# Patient Record
Sex: Female | Born: 2000 | Race: White | Hispanic: No | Marital: Single | Smoking: Never smoker
Health system: Southern US, Community
[De-identification: ages and names within clinical notes are randomized; demographics above are authoritative.]

## PROBLEM LIST (undated history)

## (undated) DIAGNOSIS — F419 Anxiety disorder, unspecified: Secondary | ICD-10-CM

## (undated) DIAGNOSIS — R569 Unspecified convulsions: Secondary | ICD-10-CM

## (undated) DIAGNOSIS — N159 Renal tubulo-interstitial disease, unspecified: Secondary | ICD-10-CM

## (undated) DIAGNOSIS — K219 Gastro-esophageal reflux disease without esophagitis: Secondary | ICD-10-CM

## (undated) HISTORY — DX: Gastro-esophageal reflux disease without esophagitis: K21.9

## (undated) HISTORY — DX: Renal tubulo-interstitial disease, unspecified: N15.9

## (undated) HISTORY — DX: Anxiety disorder, unspecified: F41.9

## (undated) HISTORY — PX: WISDOM TOOTH EXTRACTION: SHX21

## (undated) HISTORY — PX: NO PAST SURGERIES: SHX2092

---
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
## 2021-01-02 ENCOUNTER — Ambulatory Visit
Admission: RE | Admit: 2021-01-02 | Discharge: 2021-01-02 | Disposition: A | Discharge status: Home / Self Care | Payer: Medicaid Other | Attending: Internal Medicine | Admitting: Internal Medicine

## 2021-01-02 ENCOUNTER — Other Ambulatory Visit: Payer: Self-pay

## 2021-01-02 ENCOUNTER — Ambulatory Visit
Admission: RE | Admit: 2021-01-02 | Discharge: 2021-01-02 | Disposition: A | Discharge status: Home / Self Care | Payer: Medicaid Other | Source: Ambulatory Visit | Attending: Internal Medicine | Admitting: Internal Medicine

## 2021-01-02 ENCOUNTER — Ambulatory Visit (INDEPENDENT_AMBULATORY_CARE_PROVIDER_SITE_OTHER): Payer: Medicaid Other | Admitting: Internal Medicine

## 2021-01-02 ENCOUNTER — Encounter: Payer: Self-pay | Admitting: Internal Medicine

## 2021-01-02 VITALS — BP 111/63 | HR 91 | Temp 97.7°F | Resp 17 | Ht 69.0 in | Wt 145.0 lb

## 2021-01-02 DIAGNOSIS — F419 Anxiety disorder, unspecified: Secondary | ICD-10-CM | POA: Diagnosis not present

## 2021-01-02 DIAGNOSIS — K219 Gastro-esophageal reflux disease without esophagitis: Secondary | ICD-10-CM

## 2021-01-02 DIAGNOSIS — M545 Low back pain, unspecified: Secondary | ICD-10-CM | POA: Insufficient documentation

## 2021-01-02 DIAGNOSIS — G8929 Other chronic pain: Secondary | ICD-10-CM

## 2021-01-02 DIAGNOSIS — Z3046 Encounter for surveillance of implantable subdermal contraceptive: Secondary | ICD-10-CM

## 2021-01-02 DIAGNOSIS — F32A Depression, unspecified: Secondary | ICD-10-CM

## 2021-01-02 NOTE — Assessment & Plan Note (Signed)
Stable on Venlafaxine Support offered 

## 2021-01-02 NOTE — Assessment & Plan Note (Signed)
X-ray lumbar spine ordered today Consider referral for PT versus MRI pending results

## 2021-01-02 NOTE — Progress Notes (Signed)
Subjective:    Patient ID: Dana Phillips, female    DOB: 09-24-2000, 20 y.o.   MRN: [id number]  HPI  Patient presents to clinic today for follow-up of chronic conditions.  She is establishing care with me today, transferring care from Malva Cogan, NP.  GERD: Currently not an issue. She is not taking any medications for this. There is no upper GI on file.  Anxiety and Depression: Chronic, managed on Venlafaxine.  She is not currently seeing a therapist.  She denies SI/HI.  Chronic Low Back Pain with Sciatica: She feels like this has been getting worse. She describes the pain sharp.  The pain radiates into her legs but she denies numbness, tingling or weakness. She takes CBD gummies for pain relief. She hs been seeing a chiropractor with minimal relief of symptoms. X-ray lumbar spine from 10/2015 reviewed.  She would like a referral to GYN for Nexplanon removal.  Review of Systems     Past Medical History:  Diagnosis Date   Anxiety    GERD (gastroesophageal reflux disease)    Kidney infection    Seizures (HCC)     Current Outpatient Medications  Medication Sig Dispense Refill   NON FORMULARY Take 75 mg by mouth daily. CBD Gummy     venlafaxine XR (EFFEXOR XR) 37.5 MG 24 hr capsule Take 1 capsule (37.5 mg total) by mouth daily with breakfast. 30 capsule 1   No current facility-administered medications for this visit.    No Known Allergies  Family History  Problem Relation Age of Onset   Hypertension Mother    Sudden Cardiac Death Neg Hx     Social History   Socioeconomic History   Marital status: Single    Spouse name: Not on file   Number of children: Not on file   Years of education: Not on file   Highest education level: Not on file  Occupational History   Not on file  Tobacco Use   Smoking status: Never   Smokeless tobacco: Never  Vaping Use   Vaping Use: Every day   Substances: Nicotine, Flavoring  Substance and Sexual Activity   Alcohol use: No    Drug use: No   Sexual activity: Not on file  Other Topics Concern   Not on file  Social History Narrative   Not on file   Social Determinants of Health   Financial Resource Strain: Not on file  Food Insecurity: Not on file  Transportation Needs: Not on file  Physical Activity: Not on file  Stress: Not on file  Social Connections: Not on file  Intimate Partner Violence: Not on file     Constitutional: Denies fever, malaise, fatigue, headache or abrupt weight changes.  HEENT: Denies eye pain, eye redness, ear pain, ringing in the ears, wax buildup, runny nose, nasal congestion, bloody nose, or sore throat. Respiratory: Denies difficulty breathing, shortness of breath, cough or sputum production.   Cardiovascular: Denies chest pain, chest tightness, palpitations or swelling in the hands or feet.  Gastrointestinal: Denies abdominal pain, bloating, constipation, diarrhea or blood in the stool.  GU: Denies urgency, frequency, pain with urination, burning sensation, blood in urine, odor or discharge. Musculoskeletal: Patient reports low back pain.  Denies decrease in range of motion, difficulty with gait, or joint swelling.  Skin: Denies redness, rashes, lesions or ulcercations.  Neurological: Denies dizziness, difficulty with memory, difficulty with speech or problems with balance and coordination.  Psych: Patient has a history of anxiety and depression.  Denies SI/HI.  No other specific complaints in a complete review of systems (except as listed in HPI above).  Objective:   Physical Exam   BP 111/63 (BP Location: Right Arm, Patient Position: Sitting, Cuff Size: Normal)   Pulse 91   Temp 97.7 F (36.5 C) (Temporal)   Resp 17   Ht 5\' 9"  (1.753 m)   Wt 145 lb (65.8 kg)   SpO2 100%   BMI 21.41 kg/m   Wt Readings from Last 3 Encounters:  11/27/20 131 lb (59.4 kg) (58 %, Z= 0.21)*  11/21/20 133 lb 3.2 oz (60.4 kg) (62 %, Z= 0.31)*  02/12/20 140 lb (63.5 kg) (74 %, Z= 0.66)*    * Growth percentiles are based on CDC (Girls, 2-20 Years) data.    General: Appears her stated age, well developed, well nourished in NAD. Skin: Warm, dry and intact.  HEENT: Head: normal shape and size; Eyes: EOMs intact;  Cardiovascular: Normal rate and rhythm. S1,S2 noted.  No murmur, rubs or gallops noted.  Pulmonary/Chest: Normal effort and positive vesicular breath sounds. No respiratory distress. No wheezes, rales or ronchi noted.  Abdomen: Soft and nontender. Normal bowel sounds.  Musculoskeletal: Bony tenderness noted over the lumbar spine.  Strength 5/5 BLE.  No difficulty with gait.  Neurological: Alert and oriented.  Psychiatric: Mood and affect mildly flat. Behavior is normal. Judgment and thought content normal.    BMET    Component Value Date/Time   NA 137 02/12/2020 1026   K 4.1 02/12/2020 1026   CL 102 02/12/2020 1026   CO2 25 02/12/2020 1026   GLUCOSE 103 (H) 02/12/2020 1026   BUN 10 02/12/2020 1026   CREATININE 0.67 02/12/2020 1026   CREATININE 0.69 10/05/2019 1559   CALCIUM 9.6 02/12/2020 1026   GFRNONAA >60 02/12/2020 1026   GFRNONAA SEE NOTE 07/22/2016 1157   GFRAA NOT CALCULATED 08/01/2019 2057   GFRAA SEE NOTE 07/22/2016 1157    Lipid Panel  No results found for: CHOL, TRIG, HDL, CHOLHDL, VLDL, LDLCALC  CBC    Component Value Date/Time   WBC 6.2 02/12/2020 1026   RBC 4.18 02/12/2020 1026   HGB 13.3 02/12/2020 1026   HCT 39.9 02/12/2020 1026   PLT 260 02/12/2020 1026   MCV 95.5 02/12/2020 1026   MCH 31.8 02/12/2020 1026   MCHC 33.3 02/12/2020 1026   RDW 12.4 02/12/2020 1026   LYMPHSABS 2,344 10/05/2019 1559   MONOABS 0.8 08/01/2019 2057   EOSABS 180 10/05/2019 1559   BASOSABS 19 10/05/2019 1559    Hgb A1C No results found for: HGBA1C         Assessment & Plan:   Encounter for removal and insertion of Nexplanon:  Referral to GYN to have this done  We will follow-up after imaging with further recommendation and treatment  plan 10/07/2019, NP This visit occurred during the SARS-CoV-2 public health emergency.  Safety protocols were in place, including screening questions prior to the visit, additional usage of staff PPE, and extensive cleaning of exam room while observing appropriate contact time as indicated for disinfecting solutions.

## 2021-01-02 NOTE — Patient Instructions (Signed)
Generalized Anxiety Disorder, Adult Generalized anxiety disorder (GAD) is a mental health condition. Unlike normal worries, anxiety related to GAD is not triggered by a specific event. These worries do not fade or get better with time. GAD interferes with relationships, work, and school. GAD symptoms can vary from mild to severe. People with severe GAD can have intense waves of anxiety with physical symptoms that are similar to panic attacks. What are the causes? The exact cause of GAD is not known, but the following are believed to have an impact: Differences in natural brain chemicals. Genes passed down from parents to children. Differences in the way threats are perceived. Development during childhood. Personality. What increases the risk? The following factors may make you more likely to develop this condition: Being female. Having a family history of anxiety disorders. Being very shy. Experiencing very stressful life events, such as the death of a loved one. Having a very stressful family environment. What are the signs or symptoms? People with GAD often worry excessively about many things in their lives, such as their health and family. Symptoms may also include: Mental and emotional symptoms: Worrying excessively about natural disasters. Fear of being late. Difficulty concentrating. Fears that others are judging your performance. Physical symptoms: Fatigue. Headaches, muscle tension, muscle twitches, trembling, or feeling shaky. Feeling like your heart is pounding or beating very fast. Feeling out of breath or like you cannot take a deep breath. Having trouble falling asleep or staying asleep, or experiencing restlessness. Sweating. Nausea, diarrhea, or irritable bowel syndrome (IBS). Behavioral symptoms: Experiencing erratic moods or irritability. Avoidance of new situations. Avoidance of people. Extreme difficulty making decisions. How is this diagnosed? This condition  is diagnosed based on your symptoms and medical history. You will also have a physical exam. Your health care provider may perform tests to rule out other possible causes of your symptoms. To be diagnosed with GAD, a person must have anxiety that: Is out of his or her control. Affects several different aspects of his or her life, such as work and relationships. Causes distress that makes him or her unable to take part in normal activities. Includes at least three symptoms of GAD, such as restlessness, fatigue, trouble concentrating, irritability, muscle tension, or sleep problems. Before your health care provider can confirm a diagnosis of GAD, these symptoms must be present more days than they are not, and they must last for 6 months or longer. How is this treated? This condition may be treated with: Medicine. Antidepressant medicine is usually prescribed for long-term daily control. Anti-anxiety medicines may be added in severe cases, especially when panic attacks occur. Talk therapy (psychotherapy). Certain types of talk therapy can be helpful in treating GAD by providing support, education, and guidance. Options include: Cognitive behavioral therapy (CBT). People learn coping skills and self-calming techniques to ease their physical symptoms. They learn to identify unrealistic thoughts and behaviors and to replace them with more appropriate thoughts and behaviors. Acceptance and commitment therapy (ACT). This treatment teaches people how to be mindful as a way to cope with unwanted thoughts and feelings. Biofeedback. This process trains you to manage your body's response (physiological response) through breathing techniques and relaxation methods. You will work with a therapist while machines are used to monitor your physical symptoms. Stress management techniques. These include yoga, meditation, and exercise. A mental health specialist can help determine which treatment is best for you. Some  people see improvement with one type of therapy. However, other people require a combination   of therapies. Follow these instructions at home: Lifestyle Maintain a consistent routine and schedule. Anticipate stressful situations. Create a plan, and allow extra time to work with your plan. Practice stress management or self-calming techniques that you have learned from your therapist or your health care provider. General instructions Take over-the-counter and prescription medicines only as told by your health care provider. Understand that you are likely to have setbacks. Accept this and be kind to yourself as you persist to take better care of yourself. Recognize and accept your accomplishments, even if you judge them as small. Keep all follow-up visits as told by your health care provider. This is important. Contact a health care provider if: Your symptoms do not get better. Your symptoms get worse. You have signs of depression, such as: A persistently sad or irritable mood. Loss of enjoyment in activities that used to bring you joy. Change in weight or eating. Changes in sleeping habits. Avoiding friends or family members. Loss of energy for normal tasks. Feelings of guilt or worthlessness. Get help right away if: You have serious thoughts about hurting yourself or others. If you ever feel like you may hurt yourself or others, or have thoughts about taking your own life, get help right away. Go to your nearest emergency department or: Call your local emergency services (911 in the [place] Call a suicide crisis helpline, such as the National Suicide Prevention Lifeline at 1-800-273-8255. This is open 24 hours a day in the [place] Text the Crisis Text Line at 741741 (in the [place] Summary Generalized anxiety disorder (GAD) is a mental health condition that involves worry that is not triggered by a specific event. People with GAD often worry excessively about many things in their lives, such  as their health and family. GAD may cause symptoms such as restlessness, trouble concentrating, sleep problems, frequent sweating, nausea, diarrhea, headaches, and trembling or muscle twitching. A mental health specialist can help determine which treatment is best for you. Some people see improvement with one type of therapy. However, other people require a combination of therapies. This information is not intended to replace advice given to you by your health care provider. Make sure you discuss any questions you have with your health care provider. Document Revised: 12/28/2018 Document Reviewed: 12/28/2018 Elsevier Patient Education  2022 Elsevier Inc.  

## 2021-01-03 ENCOUNTER — Telehealth: Payer: Self-pay

## 2021-01-03 NOTE — Telephone Encounter (Signed)
Dana Phillips medical referring for Nexplanon removal and insertion. Patient is scheduled for 10/18 at 9 :10 with ABC

## 2021-01-03 NOTE — Telephone Encounter (Signed)
Noted. Nexplanon reserved for this patient. 

## 2021-01-06 NOTE — Progress Notes (Signed)
Cardiology Office Note:    Date:  01/08/2021   ID:  Dana Phillips, DOB 2000/05/15, MRN [id number]  PCP:  Lorre Munroe, NP  [facility] Cardiologist:  None  [facility] Electrophysiologist:  None   Referring MD: Lorre Munroe, NP   Chief Complaint: F/u heart monitor  History of Present Illness:    Dana Phillips is a 20 y.o. female with a hx of near syncope, anxiety, GERD, seizures in the setting of diphenhydramine overdose. Seen 11/27/20 reporting episodes of lightheadedness. She was seen at the [facility] ED and 11/09/2020 for near syncopal episode. Work-up including echocardiogram, was unremarkable. She uses CBD gummies for anxiety.   Today, heart monitor reviewed in detail. Since the last visit has been feeling ok. No further LOC. Feels dizzy and lightheaded one day she as sick. Eating and drinking normally. Works at [employer]. On her feet all day. Drinks lots of water. Uses CBD gummies for anxiety and sleep. Recently started Effexor by PCP. Patient feels very stressed out. Trying to get counseling for personal issues. She has occasional palpitations, they seem to be worse with anxiety. She still vapes. No alcohol use.   Past Medical History:  Diagnosis Date   Anxiety    GERD (gastroesophageal reflux disease)    Kidney infection    Seizures (HCC)     History reviewed. No pertinent surgical history.  Current Medications: Current Meds  Medication Sig   etonogestrel (NEXPLANON) 68 MG IMPL implant 1 each by Subdermal route once.   NON FORMULARY Take 75 mg by mouth daily. CBD Gummy   venlafaxine XR (EFFEXOR XR) 37.5 MG 24 hr capsule Take 1 capsule (37.5 mg total) by mouth daily with breakfast.     Allergies:   Patient has no known allergies.   Social History   Socioeconomic History   Marital status: Single    Spouse name: Not on file   Number of children: Not on file   Years of education: Not on file   Highest education level: Not on file  Occupational  History   Not on file  Tobacco Use   Smoking status: Never   Smokeless tobacco: Never  Vaping Use   Vaping Use: Every day   Substances: Nicotine, Flavoring  Substance and Sexual Activity   Alcohol use: No   Drug use: No   Sexual activity: Not on file  Other Topics Concern   Not on file  Social History Narrative   Not on file   Social Determinants of Health   Financial Resource Strain: Not on file  Food Insecurity: Not on file  Transportation Needs: Not on file  Physical Activity: Not on file  Stress: Not on file  Social Connections: Not on file     Family History: The patient's family history includes Hypertension in her mother. There is no history of Sudden Cardiac Death.  ROS:   Please see the history of present illness.     All other systems reviewed and are negative.  EKGs/Labs/Other Studies Reviewed:    The following studies were reviewed today:   Heart monitor 11/2020 The patient was monitored for 13 days, 23 hours. The predominant rhythm was sinus with an average rate of 90 bpm (range 50-171 bpm). There were rare PAC's and PVC's. Four atrial runs lasting up to 13 beats occurred with a maximum rate of 162 bpm. No sustained arrhythmia or prolonged pause was observed. Patient triggered events correspond to sinus tachycardia (no symptoms reported).  Predominantly sinus rhythm with rare PAC's and PVC's, as well as a few brief episodes of PSVT.  EKG:  EKG is not ordered today.   Recent Labs: 02/12/2020: BUN 10; Creatinine, Ser 0.67; Hemoglobin 13.3; Platelets 260; Potassium 4.1; Sodium 137  Recent Lipid Panel No results found for: CHOL, TRIG, HDL, CHOLHDL, VLDL, LDLCALC, LDLDIRECT   Physical Exam:    VS:  BP 110/68 (BP Location: Left Arm, Patient Position: Sitting, Cuff Size: Normal)   Pulse 74   Ht 5\' 10"  (1.778 m)   Wt 139 lb (63 kg)   LMP 12/19/2020 (Approximate)   SpO2 98%   BMI 19.94 kg/m     Wt Readings from Last 3 Encounters:  01/08/21 139 lb  (63 kg) (70 %, Z= 0.52)*  01/02/21 145 lb (65.8 kg) (77 %, Z= 0.74)*  11/27/20 131 lb (59.4 kg) (58 %, Z= 0.21)*   * Growth percentiles are based on CDC (Girls, 2-20 Years) data.     GEN:  Well nourished, well developed in no acute distress HEENT: Normal NECK: No JVD; No carotid bruits LYMPHATICS: No lymphadenopathy CARDIAC: RRR, no murmurs, rubs, gallops RESPIRATORY:  Clear to auscultation without rales, wheezing or rhonchi  ABDOMEN: Soft, non-tender, non-distended MUSCULOSKELETAL:  No edema; No deformity  SKIN: Warm and dry NEUROLOGIC:  Alert and oriented x 3 PSYCHIATRIC:  Normal affect   ASSESSMENT:    1. Syncope, unspecified syncope type   2. Tobacco use    PLAN:    In order of problems listed above:  Near syncope Prior work-up by [facility] (with echo) was unremarkable. Recent heart monitor showed NSR, rare PACs/PVCs, and brief runs of SVT longest 13 beats, triggered events correlated with sinus tachycardia. Patient denies further syncopal episodes. She had one day feeling sick with GI issues, and felt dizzy and lightheaded that day. Other than that, she has been feeling well. She has been drinking more water, eating normally. BP and heart rate good. She has palpitations that seem to be worse with stress. Life and family are very stressful, seeking counseling/therapy for some personal issues. Suspect this might be contributing to some symptoms. She was recently started on Effexor, PCP is following this. At this point symptoms are well controlled. If palpitations worsen can consider short-acting diltiazem PRN. We will see her back in 3 months to assess symptoms.   Tobacco use She is still Vaping. Cessation advised.   Disposition: Follow up in 3 month(s) with MD/APP     Signed, Teriana Danker [facility], PA-C  01/08/2021 10:12 AM    [facility]

## 2021-01-07 ENCOUNTER — Encounter: Payer: Medicaid Other | Admitting: Obstetrics and Gynecology

## 2021-01-08 ENCOUNTER — Encounter: Payer: Self-pay | Admitting: Medical

## 2021-01-08 ENCOUNTER — Ambulatory Visit (INDEPENDENT_AMBULATORY_CARE_PROVIDER_SITE_OTHER): Payer: Medicaid Other | Admitting: Medical

## 2021-01-08 ENCOUNTER — Ambulatory Visit: Payer: Medicaid Other | Admitting: Medical

## 2021-01-08 ENCOUNTER — Other Ambulatory Visit: Payer: Self-pay

## 2021-01-08 VITALS — BP 110/68 | HR 74 | Ht 70.0 in | Wt 139.0 lb

## 2021-01-08 DIAGNOSIS — R55 Syncope and collapse: Secondary | ICD-10-CM | POA: Diagnosis not present

## 2021-01-08 DIAGNOSIS — Z720 Tobacco use: Secondary | ICD-10-CM | POA: Diagnosis not present

## 2021-01-08 NOTE — Patient Instructions (Signed)
Medication Instructions:  No changes at this time.   *If you need a refill on your cardiac medications before your next appointment, please call your pharmacy*   Lab Work: None  If you have labs (blood work) drawn today and your tests are completely normal, you will receive your results only by: MyChart Message (if you have MyChart) OR A paper copy in the mail If you have any lab test that is abnormal or we need to change your treatment, we will call you to review the results.   Testing/Procedures: None   Follow-Up: At [facility], you and your health needs are our priority.  As part of our continuing mission to provide you with exceptional heart care, we have created designated Provider Care Teams.  These Care Teams include your primary Cardiologist (physician) and Advanced Practice Providers (APPs -  Physician Assistants and Nurse Practitioners) who all work together to provide you with the care you need, when you need it.  Your next appointment:   1 month(s)  The format for your next appointment:   In Person  Provider:   Follow up in one month with Dr. Okey Dupre or Cadence Fransico Michael PA-C

## 2021-01-08 NOTE — Progress Notes (Signed)
Dana Munroe, NP   Chief Complaint  Patient presents with   Contraception    Nexplanon replacement    HPI:      [name] is a 20 y.o. No obstetric history on file. whose LMP was Patient's last menstrual period was 12/19/2020 (approximate)., presents today for NP nexplanon replacement, referred by PCP. Nexplanon placed 11/19. Has monthly menses, lasting 3-7 days, mod flow, no BTB, mild dysmen, improved with NSAIDs/heating pad prn. Would like another one.  She is sex active, no recent STD testing. No pain/bleeding with sex.  Past Medical History:  Diagnosis Date   Anxiety    GERD (gastroesophageal reflux disease)    Kidney infection    Seizures (HCC)     Past Surgical History:  Procedure Laterality Date   NO PAST SURGERIES      Family History  Problem Relation Age of Onset   Hypertension Mother    Sudden Cardiac Death Neg Hx     Social History   Socioeconomic History   Marital status: Single    Spouse name: Not on file   Number of children: Not on file   Years of education: Not on file   Highest education level: Not on file  Occupational History   Not on file  Tobacco Use   Smoking status: Never   Smokeless tobacco: Never  Vaping Use   Vaping Use: Every day   Substances: Nicotine, Flavoring  Substance and Sexual Activity   Alcohol use: No   Drug use: No   Sexual activity: Yes    Birth control/protection: Implant, Condom  Other Topics Concern   Not on file  Social History Narrative   Not on file   Social Determinants of Health   Financial Resource Strain: Not on file  Food Insecurity: Not on file  Transportation Needs: Not on file  Physical Activity: Not on file  Stress: Not on file  Social Connections: Not on file  Intimate Partner Violence: Not on file    Outpatient Medications Prior to Visit  Medication Sig Dispense Refill   NON FORMULARY Take 75 mg by mouth daily. CBD Gummy     venlafaxine XR (EFFEXOR XR) 37.5 MG 24 hr  capsule Take 1 capsule (37.5 mg total) by mouth daily with breakfast. 30 capsule 1   etonogestrel (NEXPLANON) 68 MG IMPL implant 1 each by Subdermal route once.     No facility-administered medications prior to visit.      ROS:  Review of Systems  Constitutional:  Negative for fever.  Gastrointestinal:  Negative for blood in stool, constipation, diarrhea, nausea and vomiting.  Genitourinary:  Negative for dyspareunia, dysuria, flank pain, frequency, hematuria, urgency, vaginal bleeding, vaginal discharge and vaginal pain.  Musculoskeletal:  Negative for back pain.  Skin:  Negative for rash.  BREAST: No symptoms   OBJECTIVE:   Vitals:  BP 120/70   Ht 5\' 10"  (1.778 m)   Wt 142 lb (64.4 kg)   LMP 12/19/2020 (Approximate)   BMI 20.37 kg/m   Physical Exam Vitals reviewed.  Constitutional:      Appearance: She is well-developed.  Pulmonary:     Effort: Pulmonary effort is normal.  Genitourinary:    General: Normal vulva.     Pubic Area: No rash.      Labia:        Right: No rash, tenderness or lesion.        Left: No rash, tenderness or lesion.  Vagina: Normal. No vaginal discharge, erythema or tenderness.     Cervix: Normal.     Uterus: Normal. Not enlarged and not tender.      Adnexa: Right adnexa normal and left adnexa normal.       Right: No mass or tenderness.         Left: No mass or tenderness.    Musculoskeletal:        General: Normal range of motion.     Cervical back: Normal range of motion.  Skin:    General: Skin is warm and dry.  Neurological:     General: No focal deficit present.     Mental Status: She is alert and oriented to person, place, and time.  Psychiatric:        Mood and Affect: Mood normal.        Behavior: Behavior normal.        Thought Content: Thought content normal.        Judgment: Judgment normal.    Nexplanon removal Procedure note - The Nexplanon was noted in the patient's arm and the end was identified. The skin was  cleansed with a Betadine solution. A small injection of subcutaneous lidocaine with epinephrine was given over the end of the implant. An incision was made at the end of the implant. The rod was noted in the incision and grasped with a hemostat. It was noted to be intact.  Steri-Strip was placed approximating the incision. Hemostasis was noted.  Nexplanon Insertion  Patient given informed consent, signed copy in the chart, time out was performed.  Appropriate time out taken.  Patient's LEFT arm was prepped and draped in the usual sterile fashion. The ruler used to measure and mark insertion area.  Pt was prepped with betadine swab and then injected with 1.0 cc of 2% lidocaine with epinephrine. Nexplanon removed form packaging,  Device confirmed in needle, then inserted full length of needle and withdrawn per handbook instructions.  Pt insertion site covered with steri-strip and a bandage.   Minimal blood loss.  Pt tolerated the procedure well.  Assessment/Plan: Screening for STD (sexually transmitted disease) - Plan: Cervicovaginal ancillary only  Encounter for removal and reinsertion of Nexplanon - Plan: etonogestrel (NEXPLANON) 68 MG IMPL implant   She was told to remove the dressing in 12-24 hours, to keep the incision area dry for 24 hours and to remove the Steristrip in 2-3  days.  Notify us if any signs of tenderness, redness, pain, or fevers develop.  Meds ordered this encounter  Medications   etonogestrel (NEXPLANON) 68 MG IMPL implant    Sig: 1 each (68 mg total) by Subdermal route once for 1 dose.    Dispense:  1 each    Refill:  0    Order Specific Question:   Supervising Provider    Answer:   Nadara Mustard [[id number]]       Return in about 1 year (around 01/09/2022).  Miklo Aken B. Raine Blodgett, PA-C 01/09/2021 11:16 AM

## 2021-01-09 ENCOUNTER — Ambulatory Visit (INDEPENDENT_AMBULATORY_CARE_PROVIDER_SITE_OTHER): Payer: Medicaid Other | Admitting: Obstetrics and Gynecology

## 2021-01-09 ENCOUNTER — Encounter: Payer: Self-pay | Admitting: Obstetrics and Gynecology

## 2021-01-09 ENCOUNTER — Other Ambulatory Visit (HOSPITAL_COMMUNITY)
Admission: RE | Admit: 2021-01-09 | Discharge: 2021-01-09 | Disposition: A | Discharge status: Home / Self Care | Payer: Medicaid Other | Source: Ambulatory Visit | Attending: Obstetrics and Gynecology | Admitting: Obstetrics and Gynecology

## 2021-01-09 VITALS — BP 120/70 | Ht 70.0 in | Wt 142.0 lb

## 2021-01-09 DIAGNOSIS — Z3046 Encounter for surveillance of implantable subdermal contraceptive: Secondary | ICD-10-CM | POA: Diagnosis not present

## 2021-01-09 DIAGNOSIS — Z113 Encounter for screening for infections with a predominantly sexual mode of transmission: Secondary | ICD-10-CM | POA: Diagnosis present

## 2021-01-09 MED ORDER — NEXPLANON 68 MG ~~LOC~~ IMPL: 1.0000 | DRUG_IMPLANT | Freq: Once | SUBCUTANEOUS | 0 refills | Status: AC

## 2021-01-09 NOTE — Patient Instructions (Signed)
I value your feedback and you entrusting us with your care. If you get a [facility] patient survey, I would appreciate you taking the time to let us know about your experience today. Thank you!  Remove the dressing in 24 hours,  keep the incision area dry for 24 hours and remove the Steristrip in 2-3  days.  Notify us if any signs of tenderness, redness, pain, or fevers develop.   

## 2021-01-10 LAB — CERVICOVAGINAL ANCILLARY ONLY
Chlamydia: NEGATIVE
Comment: NEGATIVE
Comment: NEGATIVE
Comment: NORMAL
Neisseria Gonorrhea: NEGATIVE
Trichomonas: NEGATIVE

## 2021-01-13 ENCOUNTER — Other Ambulatory Visit: Payer: Self-pay | Admitting: Internal Medicine

## 2021-01-13 NOTE — Telephone Encounter (Signed)
Patient r/s to 01/09/21 Nexplanon rcved/charged 01/09/21

## 2021-01-13 NOTE — Telephone Encounter (Signed)
Requested medications are due for refill today.  yes  Requested medications are on the active medications list.  yes  Last refill. 11/21/2020  Future visit scheduled.   no  Notes to clinic.  Missing labs.

## 2021-01-29 ENCOUNTER — Other Ambulatory Visit: Payer: Self-pay

## 2021-01-29 ENCOUNTER — Ambulatory Visit (INDEPENDENT_AMBULATORY_CARE_PROVIDER_SITE_OTHER): Payer: Medicaid Other | Admitting: Internal Medicine

## 2021-01-29 ENCOUNTER — Encounter: Payer: Self-pay | Admitting: Internal Medicine

## 2021-01-29 VITALS — BP 104/65 | HR 85 | Temp 98.2°F | Resp 17 | Ht 70.0 in | Wt 144.2 lb

## 2021-01-29 DIAGNOSIS — R94128 Abnormal results of other function studies of ear and other special senses: Secondary | ICD-10-CM

## 2021-01-29 DIAGNOSIS — F419 Anxiety disorder, unspecified: Secondary | ICD-10-CM

## 2021-01-29 DIAGNOSIS — Z01118 Encounter for examination of ears and hearing with other abnormal findings: Secondary | ICD-10-CM

## 2021-01-29 DIAGNOSIS — F32A Depression, unspecified: Secondary | ICD-10-CM

## 2021-01-29 DIAGNOSIS — H9193 Unspecified hearing loss, bilateral: Secondary | ICD-10-CM

## 2021-01-29 DIAGNOSIS — H9313 Tinnitus, bilateral: Secondary | ICD-10-CM | POA: Diagnosis not present

## 2021-01-29 MED ORDER — FLUOXETINE HCL 10 MG PO CAPS: 10.0000 mg | ORAL_CAPSULE | Freq: Every day | ORAL | 0 refills | Status: DC

## 2021-01-29 NOTE — Progress Notes (Addendum)
Subjective:    Patient ID: Dana Phillips, female    DOB: 04/11/2000, 20 y.o.   MRN: [id number]  HPI  Pt presents to the clinic today for follow up of anxiety and depression. She reports over the last few weeks, her anxiety has been worse. She reports this is related to family stress, social anxiety. Currently managed on Venlafaxine, although she feels like this is not effective. She has tried Sertraline, Escitalopram, Buproprion and Clonidine. She is not currently seeing a therapist. She denies SI/HI.  She also reports decreased hearing.  She is not sure how long this has been going on.  She reports occasionally has ringing in her ears.  She denies ear pain or drainage.  She has not tried anything OTC for this.   Review of Systems     Past Medical History:  Diagnosis Date   Anxiety    GERD (gastroesophageal reflux disease)    Kidney infection    Seizures (HCC)     Current Outpatient Medications  Medication Sig Dispense Refill   ibuprofen (ADVIL) 200 MG tablet Take 400 mg by mouth every 4 (four) hours as needed.     NON FORMULARY Take 75 mg by mouth daily. CBD Gummy     venlafaxine XR (EFFEXOR-XR) 37.5 MG 24 hr capsule TAKE 1 CAPSULE(37.5 MG) BY MOUTH DAILY WITH BREAKFAST 90 capsule 1   etonogestrel (NEXPLANON) 68 MG IMPL implant 1 each (68 mg total) by Subdermal route once for 1 dose. 1 each 0   No current facility-administered medications for this visit.    No Known Allergies  Family History  Problem Relation Age of Onset   Hypertension Mother    Sudden Cardiac Death Neg Hx     Social History   Socioeconomic History   Marital status: Single    Spouse name: Not on file   Number of children: Not on file   Years of education: Not on file   Highest education level: Not on file  Occupational History   Not on file  Tobacco Use   Smoking status: Never   Smokeless tobacco: Never  Vaping Use   Vaping Use: Every day   Substances: Nicotine, Flavoring  Substance and  Sexual Activity   Alcohol use: No   Drug use: No   Sexual activity: Yes    Birth control/protection: Implant, Condom  Other Topics Concern   Not on file  Social History Narrative   Not on file   Social Determinants of Health   Financial Resource Strain: Not on file  Food Insecurity: Not on file  Transportation Needs: Not on file  Physical Activity: Not on file  Stress: Not on file  Social Connections: Not on file  Intimate Partner Violence: Not on file     Constitutional: Denies fever, malaise, fatigue, headache or abrupt weight changes.  HEENT: Patient reports decreased hearing, ringing in the ears.  Denies runny nose, nasal congestion or sore throat. Respiratory: Denies difficulty breathing, shortness of breath, cough or sputum production.   Cardiovascular: Denies chest pain, chest tightness, palpitations or swelling in the hands or feet.  Neurological: Denies dizziness, difficulty with memory, difficulty with speech or problems with balance and coordination.  Psych: Pt has a history of anxiety and depression. Denies SI/HI.  No other specific complaints in a complete review of systems (except as listed in HPI above).  Objective:   Physical Exam   BP 104/65 (BP Location: Right Arm, Patient Position: Sitting, Cuff Size: Normal)  Pulse 85   Temp 98.2 F (36.8 C) (Temporal)   Resp 17   Ht 5\' 10"  (1.778 m)   Wt 144 lb 3.2 oz (65.4 kg)   SpO2 100%   BMI 20.69 kg/m  Wt Readings from Last 3 Encounters:  01/29/21 144 lb 3.2 oz (65.4 kg) (76 %, Z= 0.70)*  01/09/21 142 lb (64.4 kg) (74 %, Z= 0.63)*  01/08/21 139 lb (63 kg) (70 %, Z= 0.52)*   * Growth percentiles are based on CDC (Girls, 2-20 Years) data.    General: Appears her stated age, well developed, well nourished in NAD. Skin: Warm, dry and intact.  HEENT: Head: normal shape and size; Eyes: EOMs intact; Ears: TMs intact bilaterally.  + Serous effusion on left.  Abnormal Weber-lateralizes to the right.  Abnormal  Rinne on the right, normal on the left. Cardiovascular: Normal rate and rhythm. S1,S2 noted.  No murmur, rubs or gallops noted.  Pulmonary/Chest: Normal effort and positive vesicular breath sounds. No respiratory distress. No wheezes, rales or ronchi noted.  Neurological: Alert and oriented.  Psychiatric: Mood and affect normal.  Anxious appearing. Judgment and thought content normal.    BMET    Component Value Date/Time   NA 137 02/12/2020 1026   K 4.1 02/12/2020 1026   CL 102 02/12/2020 1026   CO2 25 02/12/2020 1026   GLUCOSE 103 (H) 02/12/2020 1026   BUN 10 02/12/2020 1026   CREATININE 0.67 02/12/2020 1026   CREATININE 0.69 10/05/2019 1559   CALCIUM 9.6 02/12/2020 1026   GFRNONAA >60 02/12/2020 1026   GFRNONAA SEE NOTE 07/22/2016 1157   GFRAA NOT CALCULATED 08/01/2019 2057   GFRAA SEE NOTE 07/22/2016 1157    Lipid Panel  No results found for: CHOL, TRIG, HDL, CHOLHDL, VLDL, LDLCALC  CBC    Component Value Date/Time   WBC 6.2 02/12/2020 1026   RBC 4.18 02/12/2020 1026   HGB 13.3 02/12/2020 1026   HCT 39.9 02/12/2020 1026   PLT 260 02/12/2020 1026   MCV 95.5 02/12/2020 1026   MCH 31.8 02/12/2020 1026   MCHC 33.3 02/12/2020 1026   RDW 12.4 02/12/2020 1026   LYMPHSABS 2,344 10/05/2019 1559   MONOABS 0.8 08/01/2019 2057   EOSABS 180 10/05/2019 1559   BASOSABS 19 10/05/2019 1559    Hgb A1C No results found for: HGBA1C         Assessment & Plan:   Decreased Hearing, Ringing in the Ears:  Abnormal hearing exam performed by CMA Referral to audiology for further evaluation  10/07/2019, NP This visit occurred during the SARS-CoV-2 public health emergency.  Safety protocols were in place, including screening questions prior to the visit, additional usage of staff PPE, and extensive cleaning of exam room while observing appropriate contact time as indicated for disinfecting solutions.

## 2021-01-30 NOTE — Patient Instructions (Signed)

## 2021-01-30 NOTE — Assessment & Plan Note (Signed)
Deteriorated Will D/C venlafaxine as it is not effective Rx for fluoxetine 10 mg daily  Update me in 1 month and let me know how you are doing

## 2021-02-07 ENCOUNTER — Ambulatory Visit: Payer: Medicaid Other | Admitting: Medical

## 2021-02-07 NOTE — Progress Notes (Deleted)
Cardiology Office Note:    Date:  02/07/2021   ID:  Dana Phillips, DOB 11-05-2000, MRN [id number]  PCP:  Lorre Munroe, NP  [facility] Cardiologist:  None  [facility] Electrophysiologist:  None   Referring MD: Lorre Munroe, NP   Chief Complaint: 1 month follow-up  History of Present Illness:    Dana Phillips is a 20 y.o. female with a hx of female with a hx of near syncope, anxiety, GERD, seizures in the setting of diphenhydramine overdose. Seen 11/27/20 reporting episodes of lightheadedness. She was seen at the [facility] ED and 11/09/2020 for near syncopal episode. Work-up including echocardiogram, was unremarkable. She uses CBD gummies for anxiety.   Last seen 10/19 22 and still had some dizziness. Was started on antidepressants, suspect palpitations/lightheadedness related to stress.   Today,    Past Medical History:  Diagnosis Date   Anxiety    GERD (gastroesophageal reflux disease)    Kidney infection    Seizures (HCC)     Past Surgical History:  Procedure Laterality Date   NO PAST SURGERIES      Current Medications: No outpatient medications have been marked as taking for the 02/07/21 encounter (Appointment) with Fransico Michael, Dana Matarazzo H, PA-C.     Allergies:   Patient has no known allergies.   Social History   Socioeconomic History   Marital status: Single    Spouse name: Not on file   Number of children: Not on file   Years of education: Not on file   Highest education level: Not on file  Occupational History   Not on file  Tobacco Use   Smoking status: Never   Smokeless tobacco: Never  Vaping Use   Vaping Use: Every day   Substances: Nicotine, Flavoring  Substance and Sexual Activity   Alcohol use: No   Drug use: No   Sexual activity: Yes    Birth control/protection: Implant, Condom  Other Topics Concern   Not on file  Social History Narrative   Not on file   Social Determinants of Health   Financial Resource Strain: Not  on file  Food Insecurity: Not on file  Transportation Needs: Not on file  Physical Activity: Not on file  Stress: Not on file  Social Connections: Not on file     Family History: The patient's family history includes Hypertension in her mother. There is no history of Sudden Cardiac Death.  ROS:   Please see the history of present illness.     All other systems reviewed and are negative.  EKGs/Labs/Other Studies Reviewed:    The following studies were reviewed today:   Heart monitor 11/2020 The patient was monitored for 13 days, 23 hours. The predominant rhythm was sinus with an average rate of 90 bpm (range 50-171 bpm). There were rare PAC's and PVC's. Four atrial runs lasting up to 13 beats occurred with a maximum rate of 162 bpm. No sustained arrhythmia or prolonged pause was observed. Patient triggered events correspond to sinus tachycardia (no symptoms reported).   Predominantly sinus rhythm with rare PAC's and PVC's, as well as a few brief episodes of PSVT.  EKG:  EKG is *** ordered today.  The ekg ordered today demonstrates ***  Recent Labs: 02/12/2020: BUN 10; Creatinine, Ser 0.67; Hemoglobin 13.3; Platelets 260; Potassium 4.1; Sodium 137  Recent Lipid Panel No results found for: CHOL, TRIG, HDL, CHOLHDL, VLDL, LDLCALC, LDLDIRECT   Risk Assessment/Calculations:   {Does this patient have ATRIAL FIBRILLATION?:[phone]}  Physical Exam:    VS:  There were no vitals taken for this visit.    Wt Readings from Last 3 Encounters:  01/29/21 144 lb 3.2 oz (65.4 kg) (76 %, Z= 0.70)*  01/09/21 142 lb (64.4 kg) (74 %, Z= 0.63)*  01/08/21 139 lb (63 kg) (70 %, Z= 0.52)*   * Growth percentiles are based on CDC (Girls, 2-20 Years) data.     GEN: *** Well nourished, well developed in no acute distress HEENT: Normal NECK: No JVD; No carotid bruits LYMPHATICS: No lymphadenopathy CARDIAC: ***RRR, no murmurs, rubs, gallops RESPIRATORY:  Clear to auscultation without rales,  wheezing or rhonchi  ABDOMEN: Soft, non-tender, non-distended MUSCULOSKELETAL:  No edema; No deformity  SKIN: Warm and dry NEUROLOGIC:  Alert and oriented x 3 PSYCHIATRIC:  Normal affect   ASSESSMENT:    No diagnosis found. PLAN:    In order of problems listed above:  Near syncope  Tobacco use  Disposition: Follow up {follow up:15908} with ***   Shared Decision Making/Informed Consent   {Are you ordering a CV Procedure (e.g. stress test, cath, DCCV, TEE, etc)?   Press F2        :[id number]}    Signed, Mechelle Pates Ardelle Lesches  02/07/2021 7:35 AM    [facility]

## 2021-03-03 ENCOUNTER — Ambulatory Visit: Payer: Medicaid Other | Admitting: Medical

## 2021-03-03 NOTE — Progress Notes (Deleted)
Cardiology Office Note:    Date:  03/03/2021   ID:  Dana Phillips, DOB 11-05-2000, MRN [id number]  PCP:  Lorre Munroe, NP  [facility] Cardiologist:  None  [facility] Electrophysiologist:  None   Referring MD: Lorre Munroe, NP   Chief Complaint: follow-up  History of Present Illness:    Dana Phillips is a 20 y.o. female with a hx of near syncope, anxiety, GERD, seizures in the setting of diphenhydramine overdose. Seen 11/27/20 reporting episodes of lightheadedness. She was seen at the [facility] ED and 11/09/2020 for near syncopal episode. Work-up including echocardiogram, was unremarkable. She uses CBD gummies for anxiety. Heart monitor showed predominately NSR, rare PACs, PVCs, brief pSVT.   Last seen 01/08/21 and reported dizziness, no LOC. Recently started on Effexor.   Today,   Past Medical History:  Diagnosis Date   Anxiety    GERD (gastroesophageal reflux disease)    Kidney infection    Seizures (HCC)     Past Surgical History:  Procedure Laterality Date   NO PAST SURGERIES      Current Medications: No outpatient medications have been marked as taking for the 03/03/21 encounter (Appointment) with Fransico Michael, Oluwafemi Villella H, PA-C.     Allergies:   Patient has no known allergies.   Social History   Socioeconomic History   Marital status: Single    Spouse name: Not on file   Number of children: Not on file   Years of education: Not on file   Highest education level: Not on file  Occupational History   Not on file  Tobacco Use   Smoking status: Never   Smokeless tobacco: Never  Vaping Use   Vaping Use: Every day   Substances: Nicotine, Flavoring  Substance and Sexual Activity   Alcohol use: No   Drug use: No   Sexual activity: Yes    Birth control/protection: Implant, Condom  Other Topics Concern   Not on file  Social History Narrative   Not on file   Social Determinants of Health   Financial Resource Strain: Not on file  Food  Insecurity: Not on file  Transportation Needs: Not on file  Physical Activity: Not on file  Stress: Not on file  Social Connections: Not on file     Family History: The patient's family history includes Hypertension in her mother. There is no history of Sudden Cardiac Death.  ROS:   Please see the history of present illness.     All other systems reviewed and are negative.  EKGs/Labs/Other Studies Reviewed:    The following studies were reviewed today:   Heart monitor 11/2020 The patient was monitored for 13 days, 23 hours. The predominant rhythm was sinus with an average rate of 90 bpm (range 50-171 bpm). There were rare PAC's and PVC's. Four atrial runs lasting up to 13 beats occurred with a maximum rate of 162 bpm. No sustained arrhythmia or prolonged pause was observed. Patient triggered events correspond to sinus tachycardia (no symptoms reported).   Predominantly sinus rhythm with rare PAC's and PVC's, as well as a few brief episodes of PSVT.  EKG:  EKG is *** ordered today.  The ekg ordered today demonstrates ***  Recent Labs: No results found for requested labs within last 8760 hours.  Recent Lipid Panel No results found for: CHOL, TRIG, HDL, CHOLHDL, VLDL, LDLCALC, LDLDIRECT   Risk Assessment/Calculations:   {Does this patient have ATRIAL FIBRILLATION?:[phone]}   Physical Exam:    VS:  There were no vitals taken for this visit.    Wt Readings from Last 3 Encounters:  01/29/21 144 lb 3.2 oz (65.4 kg) (76 %, Z= 0.70)*  01/09/21 142 lb (64.4 kg) (74 %, Z= 0.63)*  01/08/21 139 lb (63 kg) (70 %, Z= 0.52)*   * Growth percentiles are based on CDC (Girls, 2-20 Years) data.     GEN: *** Well nourished, well developed in no acute distress HEENT: Normal NECK: No JVD; No carotid bruits LYMPHATICS: No lymphadenopathy CARDIAC: ***RRR, no murmurs, rubs, gallops RESPIRATORY:  Clear to auscultation without rales, wheezing or rhonchi  ABDOMEN: Soft, non-tender,  non-distended MUSCULOSKELETAL:  No edema; No deformity  SKIN: Warm and dry NEUROLOGIC:  Alert and oriented x 3 PSYCHIATRIC:  Normal affect   ASSESSMENT:    No diagnosis found. PLAN:    In order of problems listed above:  Near Syncope  Tobacco use  Disposition: Follow up {follow up:15908} with ***   Shared Decision Making/Informed Consent   {Are you ordering a CV Procedure (e.g. stress test, cath, DCCV, TEE, etc)?   Press F2        :[id number]}    Signed, Kemoni Quesenberry Ardelle Lesches  03/03/2021 7:43 AM    [facility]

## 2021-03-04 ENCOUNTER — Encounter: Payer: Self-pay | Admitting: Medical

## 2021-03-11 ENCOUNTER — Encounter: Payer: Self-pay | Admitting: Internal Medicine

## 2021-03-11 ENCOUNTER — Other Ambulatory Visit: Payer: Self-pay

## 2021-03-11 ENCOUNTER — Ambulatory Visit (INDEPENDENT_AMBULATORY_CARE_PROVIDER_SITE_OTHER): Payer: Medicaid Other | Admitting: Internal Medicine

## 2021-03-11 VITALS — BP 108/59 | HR 100 | Temp 97.5°F | Resp 18 | Ht 70.0 in | Wt 144.8 lb

## 2021-03-11 DIAGNOSIS — F39 Unspecified mood [affective] disorder: Secondary | ICD-10-CM

## 2021-03-11 DIAGNOSIS — M545 Low back pain, unspecified: Secondary | ICD-10-CM | POA: Diagnosis not present

## 2021-03-11 DIAGNOSIS — G8929 Other chronic pain: Secondary | ICD-10-CM | POA: Diagnosis not present

## 2021-03-11 DIAGNOSIS — F419 Anxiety disorder, unspecified: Secondary | ICD-10-CM | POA: Insufficient documentation

## 2021-03-11 MED ORDER — LURASIDONE HCL 20 MG PO TABS: 20.0000 mg | ORAL_TABLET | Freq: Every day | ORAL | 2 refills | Status: DC

## 2021-03-11 NOTE — Assessment & Plan Note (Signed)
Encourage regular stretching Okay to continue CBD Gummies Referral to physical therapy for further evaluation and treatment

## 2021-03-11 NOTE — Assessment & Plan Note (Signed)
Likely bipolar just undiagnosed D/C Fluoxetine Rx for Latuda 20 mg daily Referral placed to psychiatry for further evaluation and medication management

## 2021-03-11 NOTE — Patient Instructions (Signed)
Bipolar I Disorder ?Bipolar I disorder is a mental health disorder in which a person has episodes of emotional highs (mania) and may also have episodes of lows (depression). Bipolar I disorder differs from other bipolar disorders in that it involves extreme episodes of mania (manic episodes). These episodes last at least one week or involve severe symptoms that require a hospital stay to keep the person safe. ?What are the causes? ?The cause of this condition is not known. ?What increases the risk? ?The following factors may make you more likely to develop this condition: ?Having a family member with the disorder. ?Having an imbalance of certain chemicals in the brain (neurotransmitters). ?Experiencing stress, such as from illness, financial problems, or a death. ?Having certain conditions that affect the brain or spinal cord (neurologic conditions). ?Having had a brain injury (trauma). ?What are the signs or symptoms? ?Symptoms of bipolar I disorder include the following: ?Symptoms of mania ?Very high self-esteem or self-confidence. ?Less need for sleep. ?Unusual talkativeness. Speech may be very fast. ?Racing thoughts with quick shifts between topics that may or may not be related (flight of ideas). ?Being much more able or much less able to concentrate. ?Increased purposeful activity, such as work, studies, or social activity. ?Increased agitation. This could be pacing, squirming, fidgeting, or finger and toe tapping. ?Impulsive behavior and poor judgment. These may lead to high-risk activities that are sexual, financial, or physical. ?Symptoms of depression ?Extreme sadness, uncontrollable crying, or feeling hopeless, worthless, or numb. ?Sleep problems, such as trouble falling asleep or staying asleep (insomnia), waking early, or sleeping too much. ?No longer enjoying things you used to enjoy. ?Isolation, or spending time alone often. ?Lack of energy or motivation, and moving more slowly than normal. ?Trouble  making decisions. ?Increased appetite or loss of appetite. ?Thoughts of death, or wanting to harm yourself. ?Sometimes, you may have a mixed mood. This means having symptoms of mania and depression at the same time. Stress can often trigger these symptoms. ?How is this diagnosed? ?This condition may be diagnosed based on a mental health evaluation that includes a review of: ?Emotional episodes. ?Medical history. ?Use of alcohol, drugs, and prescription medicines. Certain medical conditions and substances can cause secondary bipolar disorder. This has symptoms like the symptoms of bipolar disorder. ?Your health care provider may ask you to take a short test to help understand your symptoms. You may also be asked to follow up with a mental health provider for further evaluation or to start treatment. ?How is this treated? ?  ?This condition is a long-term (chronic) illness. It is often managed with ongoing treatment rather than treatment only when symptoms occur. A combination of treatments is often used. Treatment may include: ?Medicines, usually medicines called mood stabilizers. Medicines can be prescribed by a health care provider who specializes in treating mental health disorders (psychiatrist). If symptoms occur during use of a mood stabilizer, other medicines may be added. ?Talk therapy (psychotherapy) to help you manage bipolar disorder. Talk therapy includes cognitive behavioral therapy (CBT) and family therapy. ?Psychoeducation. This helps you and others understand how your disorder is managed. Include friends and family in educational sessions so they learn how best to support you. ?Methods of managing your condition, such as journaling or relaxation exercises. Exercises include: ?Yoga. ?Meditation. ?Deep breathing. ?Lifestyle changes, such as: ?Limiting alcohol and drug use. ?Exercising regularly. ?Setting a regular bedtime and wake time. ?Eating a healthy diet. ?Electroconvulsive therapy (ECT). This is a  procedure in which electricity is applied to   the brain through the scalp. ECT may be used for severe bipolar disorder when medicine and psychotherapy work too slowly or do not work. ?Follow these instructions at home: ?Activity ?Return to your normal activities as told by your health care provider. ?Find activities that you enjoy, and make time to do them. ?Get regular exercise. ?Lifestyle ? ?Follow a set schedule for eating and sleeping. ?Eat a healthy diet that includes fresh fruits and vegetables, whole grains, low-fat dairy, and lean meat. ?Get at least 7-8 hours of sleep each night. ?Avoid using products that contain nicotine or tobacco. If you want help quitting, ask your health care provider. ?Avoid alcohol and drugs. They can affect how medicine works and make symptoms worse. ?General instructions ?Take over-the-counter and prescription medicines only as told by your health care provider. You may think about stopping your medicine, but you need to take all your medicine as prescribed. This helps manage your symptoms. ?Consider joining a support group. Your health care provider may be able to recommend one. ?Talk with your family and friends about your treatment goals and how loved ones can help. ?Keep all follow-up visits. This is important. ?Where to find more information ?National Alliance on Mental Illness: nami.org ?National Institute of Mental Health: nimh.nih.gov ?Contact a health care provider if: ?Your symptoms get worse, or your loved ones tell you that your symptoms are getting worse. ?You have uncomfortable side effects from your medicine. ?You have trouble sleeping. ?You have trouble doing daily activities. ?You feel unsafe in your surroundings. ?You are using alcohol or drugs to manage your symptoms. ?Get help right away if: ?You have new symptoms. ?You have thoughts about harming yourself or others. ?You are considering suicide. ?If you ever feel like you may hurt yourself or others, or have  thoughts about taking your own life, get help right away. Go to your nearest emergency department or: ?Call your local emergency services (911 in the [place] ?Call a suicide crisis helpline, such as the National Suicide Prevention Lifeline at 1-800-273-8255 or 988 in the [place] This is open 24 hours a day in the [place] ?Text the Crisis Text Line at 741741 (in the [place] ?Summary ?Bipolar I disorder is a lifelong mental health disorder in which a person has episodes of mania and depression. ?Treatment for this disorder involves a combination of treatments such as medicines and talk and behavioral therapies. ?Include friends and family in educational sessions so they know how best to support you. ?Get help right away if you are considering suicide. ?This information is not intended to replace advice given to you by your health care provider. Make sure you discuss any questions you have with your health care provider. ?Document Revised: 10/03/2020 Document Reviewed: 08/29/2020 ?Elsevier Patient Education ? 2022 Elsevier Inc. ? ?

## 2021-03-11 NOTE — Progress Notes (Signed)
Subjective:    Patient ID: Dana Phillips, female    DOB: January 16, 2001, 20 y.o.   MRN: [id number]  HPI  Patient presents the clinic today for follow-up of anxiety.  This is currently managed on Fluoxetine.  She reports the medication is making her feel nausea despite taking it with food.  She also feels like she is having a lot of up-and-down mood swings.  She reports there are days where she goes nights without sleeping.  She reports she does not always make good decisions with regard to money spending.  She denies audio or visual hallucinations but has admitted to paranoia.  She reports her mother has been diagnosed with bipolar depression but she is not sure what medication she is taking.  She reports 1 month ago she felt like the world would be a better place if she were not here but she did not have any active suicidal or homicidal thoughts.  She also reports persistent low back pain.  She had an x-ray of her lumbar spine 10/22 which was normal.  The pain does not radiate down her legs.  She takes CBD Gummies with some relief of some  Review of Systems     Past Medical History:  Diagnosis Date   Anxiety    GERD (gastroesophageal reflux disease)    Kidney infection    Seizures (HCC)     Current Outpatient Medications  Medication Sig Dispense Refill   etonogestrel (NEXPLANON) 68 MG IMPL implant 1 each (68 mg total) by Subdermal route once for 1 dose. 1 each 0   FLUoxetine (PROZAC) 10 MG capsule Take 1 capsule (10 mg total) by mouth daily. 30 capsule 0   ibuprofen (ADVIL) 200 MG tablet Take 400 mg by mouth every 4 (four) hours as needed.     NON FORMULARY Take 75 mg by mouth daily. CBD Gummy     No current facility-administered medications for this visit.    No Known Allergies  Family History  Problem Relation Age of Onset   Hypertension Mother    Sudden Cardiac Death Neg Hx     Social History   Socioeconomic History   Marital status: Single    Spouse name: Not on file    Number of children: Not on file   Years of education: Not on file   Highest education level: Not on file  Occupational History   Not on file  Tobacco Use   Smoking status: Never   Smokeless tobacco: Never  Vaping Use   Vaping Use: Every day   Substances: Nicotine, Flavoring  Substance and Sexual Activity   Alcohol use: No   Drug use: No   Sexual activity: Yes    Birth control/protection: Implant, Condom  Other Topics Concern   Not on file  Social History Narrative   Not on file   Social Determinants of Health   Financial Resource Strain: Not on file  Food Insecurity: Not on file  Transportation Needs: Not on file  Physical Activity: Not on file  Stress: Not on file  Social Connections: Not on file  Intimate Partner Violence: Not on file     Constitutional: Denies fever, malaise, fatigue, headache or abrupt weight changes.  Respiratory: Denies difficulty breathing, shortness of breath, cough or sputum production.   Cardiovascular: Denies chest pain, chest tightness, palpitations or swelling in the hands or feet.  Gastrointestinal: Patient reports nausea.  Denies abdominal pain, bloating, constipation, diarrhea or blood in the stool.  MSK: Patient  reports low back pain.  Denies decrease in range of motion, difficulty with gait, joint pain and swelling. Neurological: Patient reports insomnia.  Denies dizziness, difficulty with memory, difficulty with speech or problems with balance and coordination.  Psych: Pt has a history of anxiety and depression. Denies SI/HI.  No other specific complaints in a complete review of systems (except as listed in HPI above).  Objective:   Physical Exam  BP (!) 108/59 (BP Location: Right Arm, Patient Position: Sitting, Cuff Size: Normal)    Pulse 100    Temp (!) 97.5 F (36.4 C) (Temporal)    Resp 18    Ht 5\' 10"  (1.778 m)    Wt 144 lb 12.8 oz (65.7 kg)    SpO2 100%    BMI 20.78 kg/m   Wt Readings from Last 3 Encounters:  01/29/21 144  lb 3.2 oz (65.4 kg) (76 %, Z= 0.70)*  01/09/21 142 lb (64.4 kg) (74 %, Z= 0.63)*  01/08/21 139 lb (63 kg) (70 %, Z= 0.52)*   * Growth percentiles are based on CDC (Girls, 2-20 Years) data.    General: Appears her stated age, well developed, well nourished in NAD. Cardiovascular: Normal rate and rhythm.  Pulmonary/Chest: Normal effort and positive vesicular breath sounds.  MSK: Normal flexion, extension and rotation of the spine.  No difficulty with gait. Neurological: Alert and oriented.  Psychiatric: Mood and affect normal.  Anxious/paranoid.. Judgment and thought content normal.     BMET    Component Value Date/Time   NA 137 02/12/2020 1026   K 4.1 02/12/2020 1026   CL 102 02/12/2020 1026   CO2 25 02/12/2020 1026   GLUCOSE 103 (H) 02/12/2020 1026   BUN 10 02/12/2020 1026   CREATININE 0.67 02/12/2020 1026   CREATININE 0.69 10/05/2019 1559   CALCIUM 9.6 02/12/2020 1026   GFRNONAA >60 02/12/2020 1026   GFRNONAA SEE NOTE 07/22/2016 1157   GFRAA NOT CALCULATED 08/01/2019 2057   GFRAA SEE NOTE 07/22/2016 1157    Lipid Panel  No results found for: CHOL, TRIG, HDL, CHOLHDL, VLDL, LDLCALC  CBC    Component Value Date/Time   WBC 6.2 02/12/2020 1026   RBC 4.18 02/12/2020 1026   HGB 13.3 02/12/2020 1026   HCT 39.9 02/12/2020 1026   PLT 260 02/12/2020 1026   MCV 95.5 02/12/2020 1026   MCH 31.8 02/12/2020 1026   MCHC 33.3 02/12/2020 1026   RDW 12.4 02/12/2020 1026   LYMPHSABS 2,344 10/05/2019 1559   MONOABS 0.8 08/01/2019 2057   EOSABS 180 10/05/2019 1559   BASOSABS 19 10/05/2019 1559    Hgb A1C No results found for: HGBA1C          Assessment & Plan:     10/07/2019, NP This visit occurred during the SARS-CoV-2 public health emergency.  Safety protocols were in place, including screening questions prior to the visit, additional usage of staff PPE, and extensive cleaning of exam room while observing appropriate contact time as indicated for disinfecting  solutions.

## 2021-05-16 NOTE — Progress Notes (Deleted)
Psychiatric Initial Adult Assessment   Patient Identification: Dana Phillips MRN:  [id number] Date of Evaluation:  05/16/2021 Referral Source: *** Chief Complaint:  No chief complaint on file.  Visit Diagnosis: No diagnosis found.  History of Present Illness:   Dana Phillips is a 21 y.o. year old female with a history of depression, anxiety, presyncope, GERD, seizure in the context of Benadryl overdose, who is referred for depression.    Daily routine: Diet:  Exercise: Support: Household:  Marital status: Number of children: Employment:  Education:   Last PCP / ongoing medical evaluation:        Associated Signs/Symptoms: Depression Symptoms:  {DEPRESSION SYMPTOMS:20000} (Hypo) Manic Symptoms:  {BHH MANIC SYMPTOMS:22872} Anxiety Symptoms:  {BHH ANXIETY SYMPTOMS:22873} Psychotic Symptoms:  {BHH PSYCHOTIC SYMPTOMS:22874} PTSD Symptoms: {BHH PTSD SYMPTOMS:22875}  Past Psychiatric History:  Outpatient:  Psychiatry admission:  Previous suicide attempt:  Past trials of medication:  History of violence:    Previous Psychotropic Medications: {YES/NO:21197}  Substance Abuse History in the last 12 months:  {yes no:314532}  Consequences of Substance Abuse: {BHH CONSEQUENCES OF SUBSTANCE ABUSE:22880}  Past Medical History:  Past Medical History:  Diagnosis Date   Anxiety    GERD (gastroesophageal reflux disease)    Kidney infection    Seizures ([place])     Past Surgical History:  Procedure Laterality Date   NO PAST SURGERIES      Family Psychiatric History: ***  Family History:  Family History  Problem Relation Age of Onset   Hypertension Mother    Sudden Cardiac Death Neg Hx     Social History:   Social History   Socioeconomic History   Marital status: Single    Spouse name: Not on file   Number of children: Not on file   Years of education: Not on file   Highest education level: Not on file  Occupational History   Not on file  Tobacco Use    Smoking status: Never   Smokeless tobacco: Never  Vaping Use   Vaping Use: Every day   Substances: Nicotine, Flavoring  Substance and Sexual Activity   Alcohol use: No   Drug use: No   Sexual activity: Yes    Birth control/protection: Implant, Condom  Other Topics Concern   Not on file  Social History Narrative   Not on file   Social Determinants of Health   Financial Resource Strain: Not on file  Food Insecurity: Not on file  Transportation Needs: Not on file  Physical Activity: Not on file  Stress: Not on file  Social Connections: Not on file    Additional Social History: ***  Allergies:  No Known Allergies  Metabolic Disorder Labs: No results found for: HGBA1C, MPG No results found for: PROLACTIN No results found for: CHOL, TRIG, HDL, CHOLHDL, VLDL, LDLCALC No results found for: TSH  Therapeutic Level Labs: No results found for: LITHIUM No results found for: CBMZ No results found for: VALPROATE  Current Medications: Current Outpatient Medications  Medication Sig Dispense Refill   etonogestrel (NEXPLANON) 68 MG IMPL implant 1 each (68 mg total) by Subdermal route once for 1 dose. 1 each 0   ibuprofen (ADVIL) 200 MG tablet Take 400 mg by mouth every 4 (four) hours as needed.     lurasidone (LATUDA) 20 MG TABS tablet Take 1 tablet (20 mg total) by mouth daily. 30 tablet 2   NON FORMULARY Take 75 mg by mouth daily. CBD Gummy     No current facility-administered medications  for this visit.    Musculoskeletal: Strength & Muscle Tone: {desc; muscle tone:32375} Gait & Station: {PE GAIT ED QX:8161427 Patient leans: {Patient Leans:21022755}  Psychiatric Specialty Exam: Review of Systems  There were no vitals taken for this visit.There is no height or weight on file to calculate BMI.  General Appearance: {Appearance:22683}  Eye Contact:  {BHH EYE CONTACT:22684}  Speech:  Clear and Coherent  Volume:  Normal  Mood:  {BHH MOOD:22306}  Affect:  {Affect  (PAA):22687}  Thought Process:  Coherent  Orientation:  Full (Time, Place, and Person)  Thought Content:  Logical  Suicidal Thoughts:  {ST/HT (PAA):22692}  Homicidal Thoughts:  {ST/HT (PAA):22692}  Memory:  Immediate;   Good  Judgement:  {Judgement (PAA):22694}  Insight:  {Insight (PAA):22695}  Psychomotor Activity:  Normal  Concentration:  Concentration: Good and Attention Span: Good  Recall:  Good  Fund of Knowledge:Good  Language: Good  Akathisia:  No  Handed:  Right  AIMS (if indicated):  not done  Assets:  Communication Skills Desire for Improvement  ADL's:  Intact  Cognition: WNL  Sleep:  {BHH GOOD/FAIR/POOR:22877}   Screenings: GAD-7    Flowsheet Row Office Visit from 03/11/2021 in [facility] Office Visit from 01/29/2021 in [facility] Office Visit from 11/21/2020 in [facility] Office Visit from 10/05/2019 in [facility] Office Visit from 10/26/2018 in [facility]  Total GAD-7 Score 18 16 19 21 18       PHQ2-9    [place] Office Visit from 03/11/2021 in [facility] Office Visit from 01/29/2021 in [facility] Office Visit from 11/21/2020 in [facility] Office Visit from 10/05/2019 in [facility] Office Visit from 10/26/2018 in [facility]  PHQ-2 Total Score 2 0 1 2 5   PHQ-9 Total Score 13 13 16 13 22       Flowsheet Row ED from 10/05/2018 in [place] [facility] from 06/01/2018 in [place] No Risk High Risk       Assessment and Plan:  Assessment  Plan   The patient demonstrates the following risk factors for suicide: Chronic risk factors for suicide include: {Chronic Risk Factors for HD:3327074. Acute risk factors for suicide include: {Acute Risk Factors for NL:6244280. Protective factors for this  patient include: {Protective Factors for Suicide FR:7288263. Considering these factors, the overall suicide risk at this point appears to be {Desc; low/moderate/high:110033}. Patient {ACTION; IS/IS GI:087931 appropriate for outpatient follow up.    Collaboration of Care: {BH OP Collaboration of Care:21014065}  Patient/Guardian was advised Release of Information must be obtained prior to any record release in order to collaborate their care with an outside provider. Patient/Guardian was advised if they have not already done so to contact the registration department to sign all necessary forms in order for [place] to release information regarding their care.   Consent: Patient/Guardian gives verbal consent for treatment and assignment of benefits for services provided during this visit. Patient/Guardian expressed understanding and agreed to proceed.   Norman Clay, MD 2/24/202311:17 AM

## 2021-05-20 ENCOUNTER — Ambulatory Visit: Payer: No Typology Code available for payment source | Admitting: Psychiatry

## 2021-06-25 ENCOUNTER — Other Ambulatory Visit: Payer: Self-pay

## 2021-06-25 ENCOUNTER — Other Ambulatory Visit: Payer: Self-pay | Admitting: Otolaryngology

## 2021-06-25 DIAGNOSIS — R42 Dizziness and giddiness: Secondary | ICD-10-CM

## 2021-07-10 ENCOUNTER — Ambulatory Visit
Admission: RE | Admit: 2021-07-10 | Discharge: 2021-07-10 | Disposition: A | Discharge status: Home / Self Care | Payer: Medicaid Other | Source: Ambulatory Visit | Attending: Otolaryngology | Admitting: Otolaryngology

## 2021-07-10 DIAGNOSIS — R42 Dizziness and giddiness: Secondary | ICD-10-CM

## 2021-07-10 MED ORDER — GADOBENATE DIMEGLUMINE 529 MG/ML IV SOLN
13.0000 mL | Freq: Once | INTRAVENOUS | Status: AC | PRN
  Administered 2021-07-10: 13 mL via INTRAVENOUS

## 2022-03-24 ENCOUNTER — Ambulatory Visit
Admission: EM | Admit: 2022-03-24 | Discharge: 2022-03-24 | Disposition: A | Discharge status: Home / Self Care | Payer: Medicaid Other | Attending: Family Medicine | Admitting: Family Medicine

## 2022-03-24 ENCOUNTER — Encounter: Payer: Self-pay | Admitting: Emergency Medicine

## 2022-03-24 DIAGNOSIS — Z20828 Contact with and (suspected) exposure to other viral communicable diseases: Secondary | ICD-10-CM | POA: Diagnosis not present

## 2022-03-24 DIAGNOSIS — Z79899 Other long term (current) drug therapy: Secondary | ICD-10-CM | POA: Insufficient documentation

## 2022-03-24 DIAGNOSIS — R6889 Other general symptoms and signs: Secondary | ICD-10-CM | POA: Diagnosis not present

## 2022-03-24 DIAGNOSIS — Z1152 Encounter for screening for COVID-19: Secondary | ICD-10-CM | POA: Diagnosis not present

## 2022-03-24 LAB — SARS CORONAVIRUS 2 BY RT PCR: SARS Coronavirus 2 by RT PCR: NEGATIVE

## 2022-03-24 MED ORDER — ONDANSETRON 4 MG PO TBDP: 4.0000 mg | ORAL_TABLET | Freq: Three times a day (TID) | ORAL | 0 refills | Status: DC | PRN

## 2022-03-24 MED ORDER — CHERATUSSIN AC 100-10 MG/5ML PO SOLN: 5.0000 mL | Freq: Three times a day (TID) | ORAL | 0 refills | Status: DC | PRN

## 2022-03-24 NOTE — Discharge Instructions (Addendum)
You likely have the flu as you have recent exposure with flu-like symptoms.  Stop by the pharmacy to pick up your prescriptions.   You can take Tylenol and/or Ibuprofen as needed for fever reduction and pain relief.    For cough: honey 1/2 to 1 teaspoon (you can dilute the honey in water or another fluid).  You can also use guaifenesin and dextromethorphan for cough. You can use a humidifier for chest congestion and cough.  If you don't have a humidifier, you can sit in the bathroom with the hot shower running.      For sore throat: try warm salt water gargles, Mucinex sore throat cough drops or cepacol lozenges, throat spray, warm tea or water with lemon/honey, popsicles or ice, or OTC cold relief medicine for throat discomfort. You can also purchase chloraseptic spray at the pharmacy or dollar store.   For congestion: take a daily anti-histamine like Zyrtec, Claritin, and a oral decongestant, such as pseudoephedrine.  You can also use Flonase 1-2 sprays in each nostril daily. Afrin is also a good option, if you do not have high blood pressure.    It is important to stay hydrated: drink plenty of fluids (water, gatorade/powerade/pedialyte, juices, or teas) to keep your throat moisturized and help further relieve irritation/discomfort.    Return or go to the Emergency Department if symptoms worsen or do not improve in the next few days

## 2022-03-24 NOTE — ED Triage Notes (Addendum)
Pt c/o nasal congestion, vomiting, body aches, chills, and headache. Started 4 days ago. Unsure if she has had a fever. Pt has had a flu exposure on christmas.

## 2022-03-24 NOTE — ED Provider Notes (Signed)
[facility]    CSN: [id number] Arrival date & time: 03/24/22  0851      History   Chief Complaint Chief Complaint  Patient presents with   Nasal Congestion    HPI Dana Phillips is a 22 y.o. female.   HPI   Dana Phillips presents for nasal congestion, chest congestion, headache, chills, feverish that started 4 days ago shortly after waking up.  She has not been sleeping well.  When she lays flat she feels like choking and then have to sit upright to get any rest.  Has diarrhea.  Around Christmas who tested positive for the flu about 3 days ago.       Past Medical History:  Diagnosis Date   Anxiety    GERD (gastroesophageal reflux disease)    Kidney infection    Seizures ([place])     Patient Active Problem List   Diagnosis Date Noted   Mood disorder ([place]) 03/11/2021   Chronic bilateral low back pain without sciatica 01/26/2020    Past Surgical History:  Procedure Laterality Date   NO PAST SURGERIES      OB History     Gravida  0   Para  0   Term  0   Preterm  0   AB  0   Living  0      SAB  0   IAB  0   Ectopic  0   Multiple  0   Live Births  0            Home Medications    Prior to Admission medications   Medication Sig Start Date End Date Taking? Authorizing Provider  etonogestrel (NEXPLANON) 68 MG IMPL implant 1 each (68 mg total) by Subdermal route once for 1 dose. 01/09/21 11/29/81 Yes Copland, Deirdre Evener, PA-C  guaiFENesin-codeine (CHERATUSSIN AC) 100-10 MG/5ML syrup Take 5 mLs by mouth 3 (three) times daily as needed for cough. 03/24/22  Yes Erna Brossard, DO  ondansetron (ZOFRAN-ODT) 4 MG disintegrating tablet Take 1 tablet (4 mg total) by mouth every 8 (eight) hours as needed. 03/24/22  Yes Anh Mangano, DO  ibuprofen (ADVIL) 200 MG tablet Take 400 mg by mouth every 4 (four) hours as needed.    [provider]  lurasidone (LATUDA) 20 MG TABS tablet Take 1 tablet (20 mg total) by mouth daily. 03/11/21   Jearld Fenton, NP  NON FORMULARY Take 75 mg by mouth daily. CBD Gummy    [provider]    Family History Family History  Problem Relation Age of Onset   Hypertension Mother    Sudden Cardiac Death Neg Hx     Social History Social History   Tobacco Use   Smoking status: Never   Smokeless tobacco: Never  Vaping Use   Vaping Use: Every day   Substances: Nicotine, Flavoring  Substance Use Topics   Alcohol use: No   Drug use: No     Allergies   Patient has no known allergies.   Review of Systems Review of Systems: negative unless otherwise stated in HPI.      Physical Exam Triage Vital Signs ED Triage Vitals  Enc Vitals Group     BP 03/24/22 1004 119/81     Pulse Rate 03/24/22 1004 78     Resp 03/24/22 1004 16     Temp 03/24/22 1004 97.8 F (36.6 C)     Temp Source 03/24/22 1004 Oral     SpO2 03/24/22  1004 98 %     Weight 03/24/22 1003 144 lb 13.5 oz (65.7 kg)     Height 03/24/22 1003 5\' 10"  (1.778 m)     Head Circumference --      Peak Flow --      Pain Score 03/24/22 1002 5     Pain Loc --      Pain Edu? --      Excl. in [place]? --    No data found.  Updated Vital Signs BP 119/81 (BP Location: Right Arm)   Pulse 78   Temp 97.8 F (36.6 C) (Oral)   Resp 16   Ht 5\' 10"  (1.778 m)   Wt 65.7 kg   SpO2 98%   BMI 20.78 kg/m   Visual Acuity Right Eye Distance:   Left Eye Distance:   Bilateral Distance:    Right Eye Near:   Left Eye Near:    Bilateral Near:     Physical Exam GEN:     alert, non-toxic appearing female in no distress    HENT:  mucus membranes moist, oropharyngeal without lesions or exudate, no tonsillar hypertrophy,  mild oropharyngeal erythema, clear nasal discharge EYES:   pupils equal and reactive, no scleral injection or discharge NECK:  normal ROM, no lymphadenopathy, no meningismus   RESP:  no increased work of breathing, clear to auscultation bilaterally CVS:   regular rate and rhythm Skin:   warm and dry    UC  Treatments / Results  Labs (all labs ordered are listed, but only abnormal results are displayed) Labs Reviewed  SARS CORONAVIRUS 2 BY RT PCR    EKG   Radiology No results found.  Procedures Procedures (including critical care time)  Medications Ordered in UC Medications - No data to display  Initial Impression / Assessment and Plan / UC Course  I have reviewed the triage vital signs and the nursing notes.  Pertinent labs & imaging results that were available during my care of the patient were reviewed by me and considered in my medical decision making (see chart for details).       Pt is a 22 y.o. female who presents for 4 days of respiratory symptoms. Carolie is afebrile here without recent antipyretics. Satting well on room air. Overall pt is non-toxic appearing, well hydrated, without respiratory distress. Pulmonary exam is unremarkable.  COVID and influenza testing deferred due to length of symptoms, influenza exposure and flu-like symptoms. Pt agrees with not testing.  She is outside the window for influenza treatment.     I suspect she also has the flu.  History most consistent with viral respiratory illness. Discussed symptomatic treatment.  Zofran for nausea.  Cheratussin for cough.  Explained lack of efficacy of antibiotics in viral disease.  Typical duration of symptoms discussed.   Return and ED precautions given and voiced understanding. Discussed MDM, treatment plan and plan for follow-up with patient who agrees with plan.     Final Clinical Impressions(s) / UC Diagnoses   Final diagnoses:  Flu-like symptoms  Exposure to influenza     Discharge Instructions      You likely have the flu as you have recent exposure with flu-like symptoms.  Stop by the pharmacy to pick up your prescriptions.   You can take Tylenol and/or Ibuprofen as needed for fever reduction and pain relief.    For cough: honey 1/2 to 1 teaspoon (you can dilute the honey in water or  another fluid).  You can also  use guaifenesin and dextromethorphan for cough. You can use a humidifier for chest congestion and cough.  If you don't have a humidifier, you can sit in the bathroom with the hot shower running.      For sore throat: try warm salt water gargles, Mucinex sore throat cough drops or cepacol lozenges, throat spray, warm tea or water with lemon/honey, popsicles or ice, or OTC cold relief medicine for throat discomfort. You can also purchase chloraseptic spray at the pharmacy or dollar store.   For congestion: take a daily anti-histamine like Zyrtec, Claritin, and a oral decongestant, such as pseudoephedrine.  You can also use Flonase 1-2 sprays in each nostril daily. Afrin is also a good option, if you do not have high blood pressure.    It is important to stay hydrated: drink plenty of fluids (water, gatorade/powerade/pedialyte, juices, or teas) to keep your throat moisturized and help further relieve irritation/discomfort.    Return or go to the Emergency Department if symptoms worsen or do not improve in the next few days      ED Prescriptions     Medication Sig Dispense Auth. Provider   ondansetron (ZOFRAN-ODT) 4 MG disintegrating tablet Take 1 tablet (4 mg total) by mouth every 8 (eight) hours as needed. 20 tablet Dailey Buccheri, DO   guaiFENesin-codeine (CHERATUSSIN AC) 100-10 MG/5ML syrup Take 5 mLs by mouth 3 (three) times daily as needed for cough. 120 mL Nicola Heinemann, Seward Meth, DO      I have reviewed the PDMP during this encounter.   Katha Cabal, DO 03/24/22 1111

## 2022-04-06 ENCOUNTER — Ambulatory Visit
Admission: EM | Admit: 2022-04-06 | Discharge: 2022-04-06 | Disposition: A | Discharge status: Home / Self Care | Payer: Medicaid Other | Attending: Physician Assistant | Admitting: Physician Assistant

## 2022-04-06 ENCOUNTER — Other Ambulatory Visit: Payer: Self-pay

## 2022-04-06 DIAGNOSIS — Z1152 Encounter for screening for COVID-19: Secondary | ICD-10-CM | POA: Diagnosis not present

## 2022-04-06 DIAGNOSIS — A084 Viral intestinal infection, unspecified: Secondary | ICD-10-CM | POA: Diagnosis not present

## 2022-04-06 DIAGNOSIS — R519 Headache, unspecified: Secondary | ICD-10-CM | POA: Diagnosis present

## 2022-04-06 LAB — RESP PANEL BY RT-PCR (RSV, FLU A&B, COVID)  RVPGX2
Influenza A by PCR: NEGATIVE
Influenza B by PCR: NEGATIVE
Resp Syncytial Virus by PCR: NEGATIVE
SARS Coronavirus 2 by RT PCR: NEGATIVE

## 2022-04-06 MED ORDER — ONDANSETRON HCL 4 MG PO TABS: 4.0000 mg | ORAL_TABLET | Freq: Three times a day (TID) | ORAL | 0 refills | Status: DC | PRN

## 2022-04-06 MED ORDER — LOPERAMIDE HCL 2 MG PO CAPS: 2.0000 mg | ORAL_CAPSULE | Freq: Four times a day (QID) | ORAL | 0 refills | Status: DC | PRN

## 2022-04-06 NOTE — ED Triage Notes (Signed)
Pt reports 2 days of diarrhea, emesis, headache and chills. Feels achy.

## 2022-04-06 NOTE — Discharge Instructions (Addendum)
Your symptoms are most likely caused by a virus, it will work its way out your system over the next few days  Covid, flu and rsv negative   You can use zofran every 8 hours as needed for nausea, be mindful this medication may make you drowsy, take the first dose at home to see how it affects your body  You can use Imodium twice a day once in the morning once in the evening and food to help with diarrhea, and be mindful over use of this medication may cause opposite effect constipation  You can use over-the-counter ibuprofen or Tylenol, which ever you have at home, to help manage fevers  Continue to promote hydration throughout the day by using electrolyte replacement solution such as Gatorade, body armor, Pedialyte, which ever you have at home  Try eating bland foods such as bread, rice, toast, fruit which are easier on the stomach to digest, avoid foods that are overly spicy, overly seasoned or greasy

## 2022-04-06 NOTE — ED Provider Notes (Signed)
[facility]    CSN: [id number] Arrival date & time: 04/06/22  0800      History   Chief Complaint Chief Complaint  Patient presents with   Diarrhea   Headache   Emesis    HPI Dana Phillips is a 22 y.o. female.   Patient presents for evaluation of generalized abdominal pain, headaches, chills, vomiting and diarrhea beginning 2 days ago.  Vomiting this morning, emesis described as food.  Last occurrence of diarrhea this morning, stool described as watery.  Has been unable to tolerate food nor liquids.  No new sick contacts.  Endorses nasal congestion and bilateral ear popping at baseline due to recent viral upper respiratory infection.  Has attempted use of over-the-counter cold and flu medication with no relief.  Past Medical History:  Diagnosis Date   Anxiety    GERD (gastroesophageal reflux disease)    Kidney infection    Seizures ([place])     Patient Active Problem List   Diagnosis Date Noted   Mood disorder ([place]) 03/11/2021   Chronic bilateral low back pain without sciatica 01/26/2020    Past Surgical History:  Procedure Laterality Date   NO PAST SURGERIES      OB History     Gravida  0   Para  0   Term  0   Preterm  0   AB  0   Living  0      SAB  0   IAB  0   Ectopic  0   Multiple  0   Live Births  0            Home Medications    Prior to Admission medications   Medication Sig Start Date End Date Taking? Authorizing Provider  etonogestrel (NEXPLANON) 68 MG IMPL implant 1 each (68 mg total) by Subdermal route once for 1 dose. 01/09/21 4/0/98  Copland, Deirdre Evener, PA-C  guaiFENesin-codeine (CHERATUSSIN AC) 100-10 MG/5ML syrup Take 5 mLs by mouth 3 (three) times daily as needed for cough. 03/24/22   Lyndee Hensen, DO  ibuprofen (ADVIL) 200 MG tablet Take 400 mg by mouth every 4 (four) hours as needed.    [provider]  lurasidone (LATUDA) 20 MG TABS tablet Take 1 tablet (20 mg total) by mouth daily. 03/11/21    Jearld Fenton, NP  NON FORMULARY Take 75 mg by mouth daily. CBD Gummy    [provider]  ondansetron (ZOFRAN-ODT) 4 MG disintegrating tablet Take 1 tablet (4 mg total) by mouth every 8 (eight) hours as needed. 03/24/22   Lyndee Hensen, DO    Family History Family History  Problem Relation Age of Onset   Hypertension Mother    Sudden Cardiac Death Neg Hx     Social History Social History   Tobacco Use   Smoking status: Never   Smokeless tobacco: Never  Vaping Use   Vaping Use: Every day   Substances: Nicotine, Flavoring  Substance Use Topics   Alcohol use: No   Drug use: Yes    Comment: CBD gummies     Allergies   Amoxicillin   Review of Systems Review of Systems  Constitutional: Negative.   HENT: Negative.    Respiratory: Negative.    Cardiovascular: Negative.   Gastrointestinal:  Positive for abdominal pain, diarrhea and vomiting. Negative for abdominal distention, anal bleeding, blood in stool, constipation, nausea and rectal pain.  Skin: Negative.   Neurological:  Positive for headaches. Negative for dizziness, tremors,  seizures, syncope, facial asymmetry, speech difficulty, weakness, light-headedness and numbness.     Physical Exam Triage Vital Signs ED Triage Vitals  Enc Vitals Group     BP 04/06/22 0815 103/70     Pulse Rate 04/06/22 0815 100     Resp 04/06/22 0815 17     Temp 04/06/22 0815 98.4 F (36.9 C)     Temp Source 04/06/22 0815 Oral     SpO2 04/06/22 0815 95 %     Weight 04/06/22 0810 155 lb (70.3 kg)     Height 04/06/22 0810 5\' 10"  (1.778 m)     Head Circumference --      Peak Flow --      Pain Score 04/06/22 0810 7     Pain Loc --      Pain Edu? --      Excl. in [place]? --    No data found.  Updated Vital Signs BP 103/70 (BP Location: Left Arm)   Pulse 100   Temp 98.4 F (36.9 C) (Oral)   Resp 17   Ht 5\' 10"  (1.778 m)   Wt 155 lb (70.3 kg)   LMP 03/19/2022   SpO2 95%   BMI 22.24 kg/m   Visual Acuity Right Eye  Distance:   Left Eye Distance:   Bilateral Distance:    Right Eye Near:   Left Eye Near:    Bilateral Near:     Physical Exam Constitutional:      Appearance: Normal appearance.  Eyes:     Extraocular Movements: Extraocular movements intact.  Pulmonary:     Effort: Pulmonary effort is normal.  Abdominal:     General: Abdomen is flat. Bowel sounds are normal.     Palpations: Abdomen is soft.     Tenderness: There is generalized abdominal tenderness.  Neurological:     Mental Status: She is alert and oriented to person, place, and time. Mental status is at baseline.  Psychiatric:        Mood and Affect: Mood normal.        Behavior: Behavior normal.      UC Treatments / Results  Labs (all labs ordered are listed, but only abnormal results are displayed) Labs Reviewed  RESP PANEL BY RT-PCR (RSV, FLU A&B, COVID)  [id number]    EKG   Radiology No results found.  Procedures Procedures (including critical care time)  Medications Ordered in UC Medications - No data to display  Initial Impression / Assessment and Plan / UC Course  I have reviewed the triage vital signs and the nursing notes.  Pertinent labs & imaging results that were available during my care of the patient were reviewed by me and considered in my medical decision making (see chart for details).  Viral gastroenteritis  Vital signs are stable, patient is in no signs of distress or toxic appearing, abdominal pain is generalized, low suspicion for an acute abdomen, etiology is most likely viral, COVID, flu and RSV testing negative, prescribe Zofran and Imodium for outpatient treatment, recommended increase fluid intake and food as tolerated, may follow-up if symptoms persist or worsen, work note given Final Clinical Impressions(s) / UC Diagnoses   Final diagnoses:  None   Discharge Instructions   None    ED Prescriptions   None    PDMP not reviewed this encounter.   Hans Eden,  [place] 04/06/22 [phone]

## 2022-07-14 ENCOUNTER — Other Ambulatory Visit: Payer: Self-pay

## 2022-07-14 ENCOUNTER — Ambulatory Visit
Admission: EM | Admit: 2022-07-14 | Discharge: 2022-07-14 | Disposition: A | Discharge status: Home / Self Care | Payer: Medicaid Other | Attending: Emergency Medicine | Admitting: Emergency Medicine

## 2022-07-14 DIAGNOSIS — J069 Acute upper respiratory infection, unspecified: Secondary | ICD-10-CM | POA: Diagnosis present

## 2022-07-14 LAB — GROUP A STREP BY PCR: Group A Strep by PCR: NOT DETECTED

## 2022-07-14 MED ORDER — IBUPROFEN 800 MG PO TABS: 800.0000 mg | ORAL_TABLET | Freq: Three times a day (TID) | ORAL | 0 refills | Status: DC

## 2022-07-14 MED ORDER — NYSTATIN 100000 UNIT/ML MT SUSP: 5.0000 mL | Freq: Four times a day (QID) | OROMUCOSAL | 0 refills | Status: DC | PRN

## 2022-07-14 MED ORDER — KETOROLAC TROMETHAMINE 30 MG/ML IJ SOLN
30.0000 mg | Freq: Once | INTRAMUSCULAR | Status: AC
  Administered 2022-07-14: 30 mg via INTRAMUSCULAR

## 2022-07-14 NOTE — ED Provider Notes (Signed)
[facility]    CSN: [id number] Arrival date & time: 07/14/22  0810      History   Chief Complaint Chief Complaint  Patient presents with   Sore Throat   Headache    HPI Dana Phillips is a 22 y.o. female.   Patient presents for evaluation of fever, nasal congestion, rhinorrhea, sore throat, nonproductive cough and a constant generalized headache beginning 1 day ago.  Fever peaking at 101.2.  Tolerating food and liquids.  Has attempted use of DayQuil, NyQuil, Mucinex, cough drops and Chloraseptic spray which has been ineffective.  No known sick contacts prior.  Denies shortness of breath and wheezing .     Past Medical History:  Diagnosis Date   Anxiety    GERD (gastroesophageal reflux disease)    Kidney infection    Seizures     Patient Active Problem List   Diagnosis Date Noted   Mood disorder 03/11/2021   Chronic bilateral low back pain without sciatica 01/26/2020    Past Surgical History:  Procedure Laterality Date   NO PAST SURGERIES      OB History     Gravida  0   Para  0   Term  0   Preterm  0   AB  0   Living  0      SAB  0   IAB  0   Ectopic  0   Multiple  0   Live Births  0            Home Medications    Prior to Admission medications   Medication Sig Start Date End Date Taking? Authorizing Provider  etonogestrel (NEXPLANON) 68 MG IMPL implant 1 each (68 mg total) by Subdermal route once for 1 dose. 01/09/21 03/24/22  Copland, Ilona Sorrel, PA-C  loperamide (IMODIUM) 2 MG capsule Take 1 capsule (2 mg total) by mouth 4 (four) times daily as needed for diarrhea or loose stools. 04/06/22   Srah Ake, Elita Boone, NP  lurasidone (LATUDA) 20 MG TABS tablet Take 1 tablet (20 mg total) by mouth daily. 03/11/21   Lorre Munroe, NP  NON FORMULARY Take 75 mg by mouth daily. CBD Gummy    [provider]  ondansetron (ZOFRAN) 4 MG tablet Take 1 tablet (4 mg total) by mouth every 8 (eight) hours as needed for nausea or  vomiting. 04/06/22   Valinda Hoar, NP    Family History Family History  Problem Relation Age of Onset   Hypertension Mother    Sudden Cardiac Death Neg Hx     Social History Social History   Tobacco Use   Smoking status: Never   Smokeless tobacco: Never  Vaping Use   Vaping Use: Every day   Substances: Nicotine, Flavoring  Substance Use Topics   Alcohol use: No   Drug use: Yes    Comment: CBD gummies     Allergies   Amoxicillin   Review of Systems Review of Systems  Constitutional:  Positive for fever. Negative for activity change, appetite change, chills, diaphoresis, fatigue and unexpected weight change.  HENT:  Positive for congestion, rhinorrhea and sore throat. Negative for dental problem, drooling, ear discharge, ear pain, facial swelling, hearing loss, mouth sores, nosebleeds, postnasal drip, sinus pressure, sinus pain, sneezing, tinnitus, trouble swallowing and voice change.   Respiratory:  Positive for cough. Negative for apnea, choking, chest tightness, shortness of breath, wheezing and stridor.   Cardiovascular: Negative.   Gastrointestinal: Negative.  Neurological:  Positive for headaches. Negative for dizziness, tremors, seizures, syncope, facial asymmetry, speech difficulty, weakness, light-headedness and numbness.     Physical Exam Triage Vital Signs ED Triage Vitals  Enc Vitals Group     BP 07/14/22 0841 121/74     Pulse Rate 07/14/22 0841 68     Resp 07/14/22 0841 18     Temp 07/14/22 0841 98.7 F (37.1 C)     Temp src --      SpO2 07/14/22 0841 100 %     Weight --      Height --      Head Circumference --      Peak Flow --      Pain Score 07/14/22 0840 6     Pain Loc --      Pain Edu? --      Excl. in [place]? --    No data found.  Updated Vital Signs BP 121/74   Pulse 68   Temp 98.7 F (37.1 C)   Resp 18   LMP 07/14/2022   SpO2 100%   Visual Acuity Right Eye Distance:   Left Eye Distance:   Bilateral Distance:    Right  Eye Near:   Left Eye Near:    Bilateral Near:     Physical Exam Constitutional:      Appearance: She is well-developed.  HENT:     Head: Normocephalic.     Right Ear: Tympanic membrane and ear canal normal.     Left Ear: Tympanic membrane and ear canal normal.     Nose: Congestion and rhinorrhea present.     Mouth/Throat:     Mouth: Mucous membranes are moist.     Pharynx: No posterior oropharyngeal erythema.     Tonsils: No tonsillar exudate. 0 on the right. 0 on the left.  Cardiovascular:     Rate and Rhythm: Normal rate and regular rhythm.     Heart sounds: Normal heart sounds.  Pulmonary:     Effort: Pulmonary effort is normal.     Breath sounds: Normal breath sounds.  Neurological:     Mental Status: She is alert.      UC Treatments / Results  Labs (all labs ordered are listed, but only abnormal results are displayed) Labs Reviewed  GROUP A STREP BY PCR    EKG   Radiology No results found.  Procedures Procedures (including critical care time)  Medications Ordered in UC Medications - No data to display  Initial Impression / Assessment and Plan / UC Course  I have reviewed the triage vital signs and the nursing notes.  Pertinent labs & imaging results that were available during my care of the patient were reviewed by me and considered in my medical decision making (see chart for details).  Viral URI with cough  Patient is in no signs of distress nor toxic appearing.  Vital signs are stable.  Low suspicion for pneumonia, pneumothorax or bronchitis and therefore will defer imaging.  Testing negative.  Given Toradol injection in office for management of headache.  Prescribed ibuprofen 800 mg and Magic mouthwash for supportive care. May use additional over-the-counter medications as needed for supportive care.  May follow-up with urgent care as needed if symptoms persist or worsen.  Note given.   Final Clinical Impressions(s) / UC Diagnoses   Final diagnoses:   None   Discharge Instructions   None    ED Prescriptions   None    PDMP not reviewed this  encounter.   Valinda Hoar, [place] 07/14/22 [phone]

## 2022-07-14 NOTE — ED Triage Notes (Signed)
Woke up with sore throat and fever yesterday and now has a headache

## 2022-07-14 NOTE — Discharge Instructions (Signed)
Your symptoms today are most likely being caused by a virus and should steadily improve in time it can take up to 7 to 10 days before you truly start to see a turnaround however things will get better  Strep test is negative for bacteria to the throat  You have been given an injection of Toradol today here in the office to help minimize your headache, ideally you will begin to see relief in approximately 30 minutes, you may use Tylenol for the remainder of the day  You may gargle and spit Magic mouthwash solution every 4 hours as needed for additional comfort  You may take ibuprofen every 8 hours as needed to help manage headaches, and you may take this in addition to Tylenol  You may continue use of over-the-counter medications, may also try any of the following below  For cough: honey 1/2 to 1 teaspoon (you can dilute the honey in water or another fluid).  You can also use guaifenesin and dextromethorphan for cough. You can use a humidifier for chest congestion and cough.  If you don't have a humidifier, you can sit in the bathroom with the hot shower running.      For sore throat: try warm salt water gargles, cepacol lozenges, throat spray, warm tea or water with lemon/honey, popsicles or ice, or OTC cold relief medicine for throat discomfort.   For congestion: take a daily anti-histamine like Zyrtec, Claritin, and a oral decongestant, such as pseudoephedrine.  You can also use Flonase 1-2 sprays in each nostril daily.   It is important to stay hydrated: drink plenty of fluids (water, gatorade/powerade/pedialyte, juices, or teas) to keep your throat moisturized and help further relieve irritation/discomfort.

## 2022-10-12 ENCOUNTER — Ambulatory Visit: Payer: Self-pay

## 2022-10-12 NOTE — Telephone Encounter (Signed)
      Chief Complaint: Pt. Has had her period for over 3 weeks. Has Nexplanon implant. Mild cramping Symptoms: Above Frequency: Over 3 weeks ago Pertinent Negatives: Patient denies  Disposition: [] ED /[] Urgent Care (no appt availability in office) / [x] Appointment(In office/virtual)/ []  [place] [facility]/ [] Home Care/ [] Refused Recommended Disposition /[] [facility]/ []  Follow-up with PCP Additional Notes:   Reason for Disposition  Periods last > 7 days  Answer Assessment - Initial Assessment Questions 1. AMOUNT: "Describe the bleeding that you are having."    - SPOTTING: spotting, or pinkish / brownish mucous discharge; does not fill panty liner or pad    - MILD:  less than 1 pad / hour; less than patient's usual menstrual bleeding   - MODERATE: 1-2 pads / hour; 1 menstrual cup every 6 hours; small-medium blood clots (e.g., pea, grape, small coin)   - SEVERE: soaking 2 or more pads/hour for 2 or more hours; 1 menstrual cup every 2 hours; bleeding not contained by pads or continuous red blood from vagina; large blood clots (e.g., golf ball, large coin)      Mild-moderate 2. ONSET: "When did the bleeding begin?" "Is it continuing now?"     Over 3 weeks  3. MENSTRUAL PERIOD: "When was the last normal menstrual period?" "How is this different than your period?"     1 month ago 4. REGULARITY: "How regular are your periods?"     Yes 5. ABDOMEN PAIN: "Do you have any pain?" "How bad is the pain?"  (e.g., Scale 1-10; mild, moderate, or severe)   - MILD (1-3): doesn't interfere with normal activities, abdomen soft and not tender to touch    - MODERATE (4-7): interferes with normal activities or awakens from sleep, abdomen tender to touch    - SEVERE (8-10): excruciating pain, doubled over, unable to do any normal activities      Mild 6. PREGNANCY: "Is there any chance you are pregnant?" "When was your last menstrual period?"     No 7. BREASTFEEDING: "Are you  breastfeeding?"     No 8. HORMONE MEDICINES: "Are you taking any hormone medicines, prescription or over-the-counter?" (e.g., birth control pills, estrogen)     Nexplanon 9. BLOOD THINNER MEDICINES: "Do you take any blood thinners?" (e.g., Coumadin / warfarin, Pradaxa / dabigatran, aspirin)     No 10. CAUSE: "What do you think is causing the bleeding?" (e.g., recent gyn surgery, recent gyn procedure; known bleeding disorder, cervical cancer, polycystic ovarian disease, fibroids)         Unsure 11. HEMODYNAMIC STATUS: "Are you weak or feeling lightheaded?" If Yes, ask: "Can you stand and walk normally?"        Weak 12. OTHER SYMPTOMS: "What other symptoms are you having with the bleeding?" (e.g., passed tissue, vaginal discharge, fever, menstrual-type cramps)       No  Protocols used: Vaginal Bleeding - Abnormal-A-AH

## 2022-10-13 ENCOUNTER — Ambulatory Visit (INDEPENDENT_AMBULATORY_CARE_PROVIDER_SITE_OTHER): Payer: MEDICAID | Admitting: Internal Medicine

## 2022-10-13 ENCOUNTER — Encounter: Payer: Self-pay | Admitting: Internal Medicine

## 2022-10-13 VITALS — BP 110/58 | HR 72 | Temp 97.4°F | Resp 18 | Wt 158.8 lb

## 2022-10-13 DIAGNOSIS — N920 Excessive and frequent menstruation with regular cycle: Secondary | ICD-10-CM

## 2022-10-13 DIAGNOSIS — F419 Anxiety disorder, unspecified: Secondary | ICD-10-CM

## 2022-10-13 DIAGNOSIS — F32A Depression, unspecified: Secondary | ICD-10-CM | POA: Diagnosis not present

## 2022-10-13 LAB — CBC
HCT: 36.3 % (ref 35.0–45.0)
Hemoglobin: 12.3 g/dL (ref 11.7–15.5)
MCH: 31.5 pg (ref 27.0–33.0)
MCHC: 33.9 g/dL (ref 32.0–36.0)
MCV: 92.8 fL (ref 80.0–100.0)
MPV: 10.8 fL (ref 7.5–12.5)
RDW: 12.2 % (ref 11.0–15.0)

## 2022-10-13 MED ORDER — FLUVOXAMINE MALEATE 25 MG PO TABS: 25.0000 mg | ORAL_TABLET | Freq: Every day | ORAL | 0 refills | Status: DC

## 2022-10-13 MED ORDER — BUSPIRONE HCL 10 MG PO TABS: 10.0000 mg | ORAL_TABLET | Freq: Two times a day (BID) | ORAL | 0 refills | Status: DC | PRN

## 2022-10-13 NOTE — Progress Notes (Signed)
Subjective:    Patient ID: Dana Phillips, female    DOB: 2000/06/09, 22 y.o.   MRN: [id number]  HPI  Patient presents to clinic today with complaint of having her menses for the last 3 weeks. She reports she is currently not bleeding. She reports associated pelvic cramping.  She has the Nexplanon implant placed about 1 year ago. She reports periods have been regular up until this point. She denies possibility of pregnancy. She has been under a lot of stress. She has no family history of thyroid disorders.  She also wants to follow up anxiety and depression. She reports this has been worse lately. She recently moved into a house with her boyfriend. They are trying to get full custody of his daughter. She stopped taking lurasidone because of side effects. She has been on lorazepam, hydroxyzine, sertraline, fluoxetine, bupropion and venlafaxine in the past. She is not currently seeing a therapist. She denies SI/HI.  Review of Systems     Past Medical History:  Diagnosis Date   Anxiety    GERD (gastroesophageal reflux disease)    Kidney infection    Seizures (HCC)     Current Outpatient Medications  Medication Sig Dispense Refill   etonogestrel (NEXPLANON) 68 MG IMPL implant 1 each (68 mg total) by Subdermal route once for 1 dose. 1 each 0   ibuprofen (ADVIL) 800 MG tablet Take 1 tablet (800 mg total) by mouth 3 (three) times daily. 21 tablet 0   loperamide (IMODIUM) 2 MG capsule Take 1 capsule (2 mg total) by mouth 4 (four) times daily as needed for diarrhea or loose stools. 12 capsule 0   lurasidone (LATUDA) 20 MG TABS tablet Take 1 tablet (20 mg total) by mouth daily. 30 tablet 2   magic mouthwash (nystatin, lidocaine, diphenhydrAMINE, alum & mag hydroxide) suspension Swish and spit 5 mLs 4 (four) times daily as needed for mouth pain. 180 mL 0   NON FORMULARY Take 75 mg by mouth daily. CBD Gummy     ondansetron (ZOFRAN) 4 MG tablet Take 1 tablet (4 mg total) by mouth every 8 (eight)  hours as needed for nausea or vomiting. 20 tablet 0   No current facility-administered medications for this visit.    Allergies  Allergen Reactions   Amoxicillin Rash    Family History  Problem Relation Age of Onset   Hypertension Mother    Sudden Cardiac Death Neg Hx     Social History   Socioeconomic History   Marital status: Single    Spouse name: Not on file   Number of children: Not on file   Years of education: Not on file   Highest education level: Not on file  Occupational History   Not on file  Tobacco Use   Smoking status: Never   Smokeless tobacco: Never  Vaping Use   Vaping status: Every Day   Substances: Nicotine, Flavoring  Substance and Sexual Activity   Alcohol use: No   Drug use: Yes    Comment: CBD gummies   Sexual activity: Yes    Birth control/protection: Implant, Condom  Other Topics Concern   Not on file  Social History Narrative   Not on file   Social Determinants of Health   Financial Resource Strain: Low Risk  (06/06/2018)   [facility]   Overall Financial Resource Strain (CARDIA)    Difficulty of Paying Living Expenses: Not hard at all  Food Insecurity: No Food  Insecurity (06/06/2018)   [facility], [facility]   Hunger Vital Sign    Worried About Running Out of Food in the Last Year: Never true    Ran Out of Food in the Last Year: Never true  Transportation Needs: No Transportation Needs (06/06/2018)   Received from [facility], [facility]   [facility] - Transportation    Lack of Transportation (Medical): No    Lack of Transportation (Non-Medical): No  Physical Activity: Sufficiently Active (06/06/2018)   Received from [facility], [facility]   Exercise Vital Sign    Days of Exercise per Week: 7 days    Minutes of Exercise per Session: 40 min  Stress: Stress Concern Present (06/06/2018)   Received from [facility], [facility] of  Occupational Health - Occupational Stress Questionnaire    Feeling of Stress : Rather much  Social Connections: Socially Isolated (06/06/2018)   Received from [facility], [facility]   Social Connection and Isolation Panel [NHANES]    Frequency of Communication with Friends and Family: Once a week    Frequency of Social Gatherings with Friends and Family: Never    Attends Religious Services: Never    Database administrator or Organizations: No    Attends Banker Meetings: Never    Marital Status: Never married  Intimate Partner Violence: Not At Risk (06/06/2018)   Received from [facility], [facility]   Humiliation, Afraid, Rape, and Kick questionnaire    Fear of Current or Ex-Partner: No    Emotionally Abused: No    Physically Abused: No    Sexually Abused: No     Constitutional: Denies fever, malaise, fatigue, headache or abrupt weight changes.  HEENT: Denies eye pain, eye redness, ear pain, ringing in the ears, wax buildup, runny nose, nasal congestion, bloody nose, or sore throat. Respiratory: Denies difficulty breathing, shortness of breath, cough or sputum production.   Cardiovascular: Denies chest pain, chest tightness, palpitations or swelling in the hands or feet.  Gastrointestinal: Denies abdominal pain, bloating, constipation, diarrhea or blood in the stool.  GU: Patient reports irregular menses.  Denies urgency, frequency, pain with urination, burning sensation, blood in urine, odor or discharge. Musculoskeletal: Denies decrease in range of motion, difficulty with gait, muscle pain or joint pain and swelling.  Skin: Denies redness, rashes, lesions or ulcercations.  Neurological: Denies dizziness, difficulty with memory, difficulty with speech or problems with balance and coordination.  Psych: Patient has a history of anxiety and depression.  Denies SI/HI.  No other specific complaints in a complete review of systems (except as listed in HPI  above).  Objective:   Physical Exam BP (!) 110/58   Pulse 72   Temp (!) 97.4 F (36.3 C)   Resp 18   Wt 158 lb 12.8 oz (72 kg)   SpO2 97%   BMI 22.79 kg/m   Wt Readings from Last 3 Encounters:  04/06/22 155 lb (70.3 kg)  03/24/22 144 lb 13.5 oz (65.7 kg)  03/11/21 144 lb 12.8 oz (65.7 kg) (76%, Z= 0.71)*   * Growth percentiles are based on CDC (Girls, 2-20 Years) data.    General: Appears her stated age, well developed, well nourished in NAD. Skin: Warm, dry and intact. Cardiovascular: Normal rate. Pulmonary/Chest: Normal effort. Musculoskeletal:  No difficulty with gait.  Neurological: Alert and oriented.  Psychiatric: Mood and mildly flat. Behavior is  normal. Judgment and thought content normal.    BMET    Component Value Date/Time   NA 137 02/12/2020 1026   K 4.1 02/12/2020 1026   CL 102 02/12/2020 1026   CO2 25 02/12/2020 1026   GLUCOSE 103 (H) 02/12/2020 1026   BUN 10 02/12/2020 1026   CREATININE 0.67 02/12/2020 1026   CREATININE 0.69 10/05/2019 1559   CALCIUM 9.6 02/12/2020 1026   GFRNONAA >60 02/12/2020 1026   GFRNONAA SEE NOTE 07/22/2016 1157   GFRAA NOT CALCULATED 08/01/2019 2057   GFRAA SEE NOTE 07/22/2016 1157    Lipid Panel  No results found for: "CHOL", "TRIG", "HDL", "CHOLHDL", "VLDL", "LDLCALC"  CBC    Component Value Date/Time   WBC 6.2 02/12/2020 1026   RBC 4.18 02/12/2020 1026   HGB 13.3 02/12/2020 1026   HCT 39.9 02/12/2020 1026   PLT 260 02/12/2020 1026   MCV 95.5 02/12/2020 1026   MCH 31.8 02/12/2020 1026   MCHC 33.3 02/12/2020 1026   RDW 12.4 02/12/2020 1026   LYMPHSABS 2,344 10/05/2019 1559   MONOABS 0.8 08/01/2019 2057   EOSABS 180 10/05/2019 1559   BASOSABS 19 10/05/2019 1559    Hgb A1C No results found for: "HGBA1C"           Assessment & Plan:   Menorrhagia:  This is likely just a side effect of the Nexplanon We will check CBC and TSH today  Schedule an appointment for your annual exam Nicki Reaper,  NP

## 2022-10-13 NOTE — Assessment & Plan Note (Signed)
Deteriorated We will trial fluvoxamine 25 mg daily for anxiety and depression Will trial buspirone 10 mg twice daily as needed for anxiety Referral to psychology and psychiatry placed Support offered

## 2022-10-14 LAB — TSH: TSH: 1.82 mIU/L

## 2022-10-14 LAB — CBC
Platelets: 252 10*3/uL (ref 140–400)
RBC: 3.91 10*6/uL (ref 3.80–5.10)
WBC: 7.4 10*3/uL (ref 3.8–10.8)

## 2022-11-09 ENCOUNTER — Encounter: Payer: MEDICAID | Admitting: Internal Medicine

## 2022-11-09 NOTE — Progress Notes (Deleted)
Subjective:    Patient ID: Dana Phillips, female    DOB: [birth year], 22 y.o.   MRN: [id number]  HPI  Patient presents to clinic today for her annual exam.  Flu: 12/2017 Tetanus: 03/2013 COVID: Never Pap smear: Never Dentist:  Diet: Exercise:   Review of Systems     Past Medical History:  Diagnosis Date   Anxiety    GERD (gastroesophageal reflux disease)    Kidney infection    Seizures (HCC)     Current Outpatient Medications  Medication Sig Dispense Refill   busPIRone (BUSPAR) 10 MG tablet Take 1 tablet (10 mg total) by mouth 2 (two) times daily as needed. 180 tablet 0   etonogestrel (NEXPLANON) 68 MG IMPL implant 1 each (68 mg total) by Subdermal route once for 1 dose. 1 each 0   fluvoxaMINE (LUVOX) 25 MG tablet Take 1 tablet (25 mg total) by mouth at bedtime. 90 tablet 0   ibuprofen (ADVIL) 800 MG tablet Take 1 tablet (800 mg total) by mouth 3 (three) times daily. 21 tablet 0   loperamide (IMODIUM) 2 MG capsule Take 1 capsule (2 mg total) by mouth 4 (four) times daily as needed for diarrhea or loose stools. 12 capsule 0   magic mouthwash (nystatin, lidocaine, diphenhydrAMINE, alum & mag hydroxide) suspension Swish and spit 5 mLs 4 (four) times daily as needed for mouth pain. 180 mL 0   NON FORMULARY Take 75 mg by mouth daily. CBD Gummy     ondansetron (ZOFRAN) 4 MG tablet Take 1 tablet (4 mg total) by mouth every 8 (eight) hours as needed for nausea or vomiting. 20 tablet 0   No current facility-administered medications for this visit.    Allergies  Allergen Reactions   Amoxicillin Rash    Family History  Problem Relation Age of Onset   Hypertension Mother    Sudden Cardiac Death Neg Hx     Social History   Socioeconomic History   Marital status: Single    Spouse name: Not on file   Number of children: Not on file   Years of education: Not on file   Highest education level: Not on file  Occupational History   Not on file  Tobacco Use   Smoking  status: Never   Smokeless tobacco: Never  Vaping Use   Vaping status: Every Day   Substances: Nicotine, Flavoring  Substance and Sexual Activity   Alcohol use: No   Drug use: Yes    Comment: CBD gummies   Sexual activity: Yes    Birth control/protection: Implant, Condom  Other Topics Concern   Not on file  Social History Narrative   Not on file   Social Determinants of Health   Financial Resource Strain: Low Risk  (06/06/2018)   [facility], [facility]   Overall Financial Resource Strain (CARDIA)    Difficulty of Paying Living Expenses: Not hard at all  Food Insecurity: No Food Insecurity (06/06/2018)   Received from [facility], [facility]   Hunger Vital Sign    Worried About Running Out of Food in the Last Year: Never true    Ran Out of Food in the Last Year: Never true  Transportation Needs: No Transportation Needs (06/06/2018)   [facility]   [facility] - Transportation    Lack of Transportation (Medical): No    Lack of Transportation (Non-Medical): No  Physical Activity: Sufficiently Active (06/06/2018)  [facility], [facility]   Exercise Vital Sign    Days of Exercise per Week: 7 days    Minutes of Exercise per Session: 40 min  Stress: Stress Concern Present (06/06/2018)   Received from [facility], [facility] of Occupational Health - Occupational Stress Questionnaire    Feeling of Stress : Rather much  Social Connections: Socially Isolated (06/06/2018)   Received [facility], [facility]   Social Connection and Isolation Panel [NHANES]    Frequency of Communication with Friends and Family: Once a week    Frequency of Social Gatherings with Friends and Family: Never    Attends Religious Services: Never    Database administrator or Organizations: No    Attends Banker Meetings: Never    Marital Status: Never married  Intimate  Partner Violence: Not At Risk (06/06/2018)   Received from [facility], [facility]   Humiliation, Afraid, Rape, and Kick questionnaire    Fear of Current or Ex-Partner: No    Emotionally Abused: No    Physically Abused: No    Sexually Abused: No     Constitutional: Denies fever, malaise, fatigue, headache or abrupt weight changes.  HEENT: Denies eye pain, eye redness, ear pain, ringing in the ears, wax buildup, runny nose, nasal congestion, bloody nose, or sore throat. Respiratory: Denies difficulty breathing, shortness of breath, cough or sputum production.   Cardiovascular: Denies chest pain, chest tightness, palpitations or swelling in the hands or feet.  Gastrointestinal: Denies abdominal pain, bloating, constipation, diarrhea or blood in the stool.  GU: Denies urgency, frequency, pain with urination, burning sensation, blood in urine, odor or discharge. Musculoskeletal: Patient reports chronic low back pain.  Denies decrease in range of motion, difficulty with gait, or joint swelling.  Skin: Denies redness, rashes, lesions or ulcercations.  Neurological: Denies dizziness, difficulty with memory, difficulty with speech or problems with balance and coordination.  Psych: Patient has a history of anxiety and depression.  Denies SI/HI.  No other specific complaints in a complete review of systems (except as listed in HPI above).  Objective:   Physical Exam   There were no vitals taken for this visit. Wt Readings from Last 3 Encounters:  10/13/22 158 lb 12.8 oz (72 kg)  04/06/22 155 lb (70.3 kg)  03/24/22 144 lb 13.5 oz (65.7 kg)    General: Appears their stated age, well developed, well nourished in NAD. Skin: Warm, dry and intact. No rashes, lesions or ulcerations noted. HEENT: Head: normal shape and size; Eyes: sclera white, no icterus, conjunctiva pink, PERRLA and EOMs intact; Ears: Tm's gray and intact, normal light reflex; Nose: mucosa pink and moist, septum midline;  Throat/Mouth: Teeth present, mucosa pink and moist, no exudate, lesions or ulcerations noted.  Neck:  Neck supple, trachea midline. No masses, lumps or thyromegaly present.  Cardiovascular: Normal rate and rhythm. S1,S2 noted.  No murmur, rubs or gallops noted. No JVD or BLE edema. No carotid bruits noted. Pulmonary/Chest: Normal effort and positive vesicular breath sounds. No respiratory distress. No wheezes, rales or ronchi noted.  Abdomen: Soft and nontender. Normal bowel sounds. No distention or masses noted. Liver, spleen and kidneys non palpable. Musculoskeletal: Normal range of motion. No signs of joint swelling. No difficulty with gait.  Neurological: Alert and oriented. Cranial nerves II-XII grossly intact. Coordination normal.  Psychiatric: Mood and affect normal. Behavior is normal. Judgment and thought content  normal.   BMET    Component Value Date/Time   NA 137 02/12/2020 1026   K 4.1 02/12/2020 1026   CL 102 02/12/2020 1026   CO2 25 02/12/2020 1026   GLUCOSE 103 (H) 02/12/2020 1026   BUN 10 02/12/2020 1026   CREATININE 0.67 02/12/2020 1026   CREATININE 0.69 10/05/2019 1559   CALCIUM 9.6 02/12/2020 1026   GFRNONAA >60 02/12/2020 1026   GFRNONAA SEE NOTE 07/22/2016 1157   GFRAA NOT CALCULATED 08/01/2019 2057   GFRAA SEE NOTE 07/22/2016 1157    Lipid Panel  No results found for: "CHOL", "TRIG", "HDL", "CHOLHDL", "VLDL", "LDLCALC"  CBC    Component Value Date/Time   WBC 7.4 10/13/2022 1636   RBC 3.91 10/13/2022 1636   HGB 12.3 10/13/2022 1636   HCT 36.3 10/13/2022 1636   PLT 252 10/13/2022 1636   MCV 92.8 10/13/2022 1636   MCH 31.5 10/13/2022 1636   MCHC 33.9 10/13/2022 1636   RDW 12.2 10/13/2022 1636   LYMPHSABS 2,344 10/05/2019 1559   MONOABS 0.8 08/01/2019 2057   EOSABS 180 10/05/2019 1559   BASOSABS 19 10/05/2019 1559    Hgb A1C No results found for: "HGBA1C"         Assessment & Plan:   Preventative health maintenance:  Encouraged her to get  a flu shot in the fall Tetanus UTD Encouraged her to get her COVID-vaccine Pap smear today-she declines STD screening Encouraged her to consume a balanced diet and exercise regimen Advised her to see an eye doctor and dentist annually Check c-Met, lipid, A1c, HIV and hep C today  RTC in 6 months, follow-up chronic conditions Nicki Reaper, NP

## 2022-11-20 ENCOUNTER — Other Ambulatory Visit: Payer: Self-pay | Admitting: Internal Medicine

## 2022-11-20 NOTE — Telephone Encounter (Signed)
Requested Prescriptions  Refused Prescriptions Disp Refills   busPIRone (BUSPAR) 10 MG tablet [Pharmacy Med Name: BUSPIRONE 10MG  TABLETS] 180 tablet 0    Sig: TAKE 1 TABLET(10 MG) BY MOUTH TWICE DAILY AS NEEDED     Psychiatry: Anxiolytics/Hypnotics - Non-controlled Failed - 11/20/2022  3:20 PM      Failed - Valid encounter within last 12 months    Recent Outpatient Visits           1 month ago Menorrhagia with regular cycle   [facility] [place], Salvadore Oxford, NP   1 year ago Mood disorder [facility])   [facility] [place], Salvadore Oxford, NP   1 year ago Decreased hearing of both ears   [place] [facility] [place], Salvadore Oxford, NP   1 year ago Encounter for removal and reinsertion of Nexplanon   [place] [facility] [place], Salvadore Oxford, NP   1 year ago Syncope, unspecified syncope type   [place] [facility] [place], Salvadore Oxford, NP       Future Appointments             In 3 weeks [place], Salvadore Oxford, NP [place] [facility]             fluvoxaMINE (LUVOX) 25 MG tablet [Pharmacy Med Name: FLUVOXAMINE 25MG  TABLETS] 90 tablet 0    Sig: TAKE 1 TABLET BY MOUTH AT BEDTIME     Psychiatry:  Antidepressants - SSRI Failed - 11/20/2022  3:20 PM      Failed - Valid encounter within last 6 months    Recent Outpatient Visits           1 month ago Menorrhagia with regular cycle   [place] [facility] [place], Salvadore Oxford, NP   1 year ago Mood disorder [facility])   [place] [facility] [place], Salvadore Oxford, NP   1 year ago Decreased hearing of both ears   [place] [facility] [place], Salvadore Oxford, NP   1 year ago Encounter for removal and reinsertion of Nexplanon   [place], Salvadore Oxford, NP   1 year ago Syncope, unspecified syncope type   [facility] [place], Salvadore Oxford, NP        Future Appointments             In 3 weeks Sam[place] Si, Salvadore Oxford, NP [facility]            Passed - Completed PHQ-2 or PHQ-9 in the last 360 days

## 2022-12-14 ENCOUNTER — Other Ambulatory Visit (HOSPITAL_COMMUNITY)
Admission: RE | Admit: 2022-12-14 | Discharge: 2022-12-14 | Disposition: A | Discharge status: Home / Self Care | Payer: MEDICAID | Source: Ambulatory Visit | Attending: Internal Medicine | Admitting: Internal Medicine

## 2022-12-14 ENCOUNTER — Encounter: Payer: Self-pay | Admitting: Internal Medicine

## 2022-12-14 ENCOUNTER — Ambulatory Visit (INDEPENDENT_AMBULATORY_CARE_PROVIDER_SITE_OTHER): Payer: MEDICAID | Admitting: Internal Medicine

## 2022-12-14 VITALS — BP 118/68 | HR 92 | Temp 96.8°F | Ht 69.0 in | Wt 160.0 lb

## 2022-12-14 DIAGNOSIS — Z113 Encounter for screening for infections with a predominantly sexual mode of transmission: Secondary | ICD-10-CM

## 2022-12-14 DIAGNOSIS — Z124 Encounter for screening for malignant neoplasm of cervix: Secondary | ICD-10-CM | POA: Insufficient documentation

## 2022-12-14 DIAGNOSIS — Z0000 Encounter for general adult medical examination without abnormal findings: Secondary | ICD-10-CM | POA: Diagnosis not present

## 2022-12-14 DIAGNOSIS — R7309 Other abnormal glucose: Secondary | ICD-10-CM

## 2022-12-14 NOTE — Patient Instructions (Signed)

## 2022-12-14 NOTE — Progress Notes (Signed)
Subjective:    Patient ID: Dana Phillips, female    DOB: 2001/01/16, 22 y.o.   MRN: [id number]  HPI  Patient presents to clinic today for her annual exam.  Flu: 12/2017 Tetanus: 03/2013 COVID: never Pap smear: never Dentist: as needed  Diet: She does eat meat. She consumes fruits and veggies. She does eat fried foods.  She drinks mostly water, soda, juice, and sports drinks Exercise: Walking  Review of Systems     Past Medical History:  Diagnosis Date   Anxiety    GERD (gastroesophageal reflux disease)    Kidney infection    Seizures (HCC)     Current Outpatient Medications  Medication Sig Dispense Refill   busPIRone (BUSPAR) 10 MG tablet Take 1 tablet (10 mg total) by mouth 2 (two) times daily as needed. 180 tablet 0   etonogestrel (NEXPLANON) 68 MG IMPL implant 1 each (68 mg total) by Subdermal route once for 1 dose. 1 each 0   fluvoxaMINE (LUVOX) 25 MG tablet Take 1 tablet (25 mg total) by mouth at bedtime. 90 tablet 0   ibuprofen (ADVIL) 800 MG tablet Take 1 tablet (800 mg total) by mouth 3 (three) times daily. 21 tablet 0   loperamide (IMODIUM) 2 MG capsule Take 1 capsule (2 mg total) by mouth 4 (four) times daily as needed for diarrhea or loose stools. 12 capsule 0   magic mouthwash (nystatin, lidocaine, diphenhydrAMINE, alum & mag hydroxide) suspension Swish and spit 5 mLs 4 (four) times daily as needed for mouth pain. 180 mL 0   NON FORMULARY Take 75 mg by mouth daily. CBD Gummy     ondansetron (ZOFRAN) 4 MG tablet Take 1 tablet (4 mg total) by mouth every 8 (eight) hours as needed for nausea or vomiting. 20 tablet 0   No current facility-administered medications for this visit.    Allergies  Allergen Reactions   Amoxicillin Rash    Family History  Problem Relation Age of Onset   Hypertension Mother    Sudden Cardiac Death Neg Hx     Social History   Socioeconomic History   Marital status: Single    Spouse name: Not on file   Number of children:  Not on file   Years of education: Not on file   Highest education level: Not on file  Occupational History   Not on file  Tobacco Use   Smoking status: Never   Smokeless tobacco: Never  Vaping Use   Vaping status: Every Day   Substances: Nicotine, Flavoring  Substance and Sexual Activity   Alcohol use: No   Drug use: Yes    Comment: CBD gummies   Sexual activity: Yes    Birth control/protection: Implant, Condom  Other Topics Concern   Not on file  Social History Narrative   Not on file   Social Determinants of Health   Financial Resource Strain: Low Risk  (06/06/2018)   Received from [facility], [facility]   Overall Financial Resource Strain (CARDIA)    Difficulty of Paying Living Expenses: Not hard at all  Food Insecurity: No Food Insecurity (06/06/2018)   Received from [facility], [facility]   Hunger Vital Sign    Worried About Running Out of Food in the Last Year: Never true    Ran Out of Food in the Last Year: Never true  Transportation Needs: No Transportation Needs (06/06/2018)   Received from [facility], [facility]  PRAPARE - Administrator, Civil Service (Medical): No    Lack of Transportation (Non-Medical): No  Physical Activity: Sufficiently Active (06/06/2018)   Received from [facility], [facility]   Exercise Vital Sign    Days of Exercise per Week: 7 days    Minutes of Exercise per Session: 40 min  Stress: Stress Concern Present (06/06/2018)   Received from [facility], [facility] - Occupational Stress Questionnaire    Feeling of Stress : Rather much  Social Connections: Socially Isolated (06/06/2018)   Received from [facility], [facility]   Social Connection and Isolation Panel [NHANES]    Frequency of Communication with Friends and Family: Once a week    Frequency of Social Gatherings with Friends and Family: Never    Attends Religious Services:  Never    Database administrator or Organizations: No    Attends Banker Meetings: Never    Marital Status: Never married  Intimate Partner Violence: Not At Risk (06/06/2018)   Received from [facility], [facility]   Humiliation, Afraid, Rape, and Kick questionnaire    Fear of Current or Ex-Partner: No    Emotionally Abused: No    Physically Abused: No    Sexually Abused: No     Constitutional: Denies fever, malaise, fatigue, headache or abrupt weight changes.  HEENT: Denies eye pain, eye redness, ear pain, ringing in the ears, wax buildup, runny nose, nasal congestion, bloody nose, or sore throat. Respiratory: Denies difficulty breathing, shortness of breath, cough or sputum production.   Cardiovascular: Denies chest pain, chest tightness, palpitations or swelling in the hands or feet.  Gastrointestinal: Denies abdominal pain, bloating, constipation, diarrhea or blood in the stool.  GU: Denies urgency, frequency, pain with urination, burning sensation, blood in urine, odor or discharge. Musculoskeletal: Patient reports chronic low back pain.  Denies decrease in range of motion, difficulty with gait, or joint swelling.  Skin: Denies redness, rashes, lesions or ulcercations.  Neurological: Denies dizziness, difficulty with memory, difficulty with speech or problems with balance and coordination.  Psych: Patient has a history of anxiety and depression.  Denies SI/HI.  No other specific complaints in a complete review of systems (except as listed in HPI above).  Objective:   Physical Exam BP 118/68 (BP Location: Right Arm, Patient Position: Sitting, Cuff Size: Normal)   Pulse 92   Temp (!) 96.8 F (36 C) (Temporal)   Ht 5\' 9"  (1.753 m)   Wt 160 lb (72.6 kg)   SpO2 98%   BMI 23.63 kg/m   Wt Readings from Last 3 Encounters:  10/13/22 158 lb 12.8 oz (72 kg)  04/06/22 155 lb (70.3 kg)  03/24/22 144 lb 13.5 oz (65.7 kg)    General: Appears her stated age, well  developed, well nourished in NAD. Skin: Warm, dry and intact.  HEENT: Head: normal shape and size; Eyes: sclera white, no icterus, conjunctiva pink, PERRLA and EOMs intact;  Neck:  Neck supple, trachea midline. No masses, lumps or thyromegaly present.  Cardiovascular: Normal rate and rhythm. S1,S2 noted.  No murmur, rubs or gallops noted. No JVD or BLE edema.  Pulmonary/Chest: Normal effort and positive vesicular breath sounds. No respiratory distress. No wheezes, rales or ronchi noted.  Abdomen: Soft and nontender. Normal bowel sounds. No distention or masses noted. Liver, spleen and kidneys non palpable. Pelvic: Normal female anatomy.  Cervix without mass  or lesion.  No CMT.  Adnexa nonpalpable. Musculoskeletal: Strength 5/5 BUE/BLE.  No difficulty with gait.  Neurological: Alert and oriented. Cranial nerves II-XII grossly intact. Coordination normal.  Psychiatric: Mood and affect normal. Behavior is normal. Judgment and thought content normal.   BMET    Component Value Date/Time   NA 137 02/12/2020 1026   K 4.1 02/12/2020 1026   CL 102 02/12/2020 1026   CO2 25 02/12/2020 1026   GLUCOSE 103 (H) 02/12/2020 1026   BUN 10 02/12/2020 1026   CREATININE 0.67 02/12/2020 1026   CREATININE 0.69 10/05/2019 1559   CALCIUM 9.6 02/12/2020 1026   GFRNONAA >60 02/12/2020 1026   GFRNONAA SEE NOTE 07/22/2016 1157   GFRAA NOT CALCULATED 08/01/2019 2057   GFRAA SEE NOTE 07/22/2016 1157    Lipid Panel  No results found for: "CHOL", "TRIG", "HDL", "CHOLHDL", "VLDL", "LDLCALC"  CBC    Component Value Date/Time   WBC 7.4 10/13/2022 1636   RBC 3.91 10/13/2022 1636   HGB 12.3 10/13/2022 1636   HCT 36.3 10/13/2022 1636   PLT 252 10/13/2022 1636   MCV 92.8 10/13/2022 1636   MCH 31.5 10/13/2022 1636   MCHC 33.9 10/13/2022 1636   RDW 12.2 10/13/2022 1636   LYMPHSABS 2,344 10/05/2019 1559   MONOABS 0.8 08/01/2019 2057   EOSABS 180 10/05/2019 1559   BASOSABS 19 10/05/2019 1559    Hgb A1C No  results found for: "HGBA1C"          Assessment & Plan:   Preventative health maintenance:  Flu shot declined Tetanus UTD Encouraged her to get her COVID booster Pap smear today, with STD screening Encouraged her to consume a balanced diet and exercise regimen Advised her to see an eye doctor and dentist annually We will check CBC, c-Met, lipid, A1c, HIV and hep C today  RTC in 6 months, follow-up chronic conditions Nicki Reaper, NP

## 2022-12-15 LAB — LIPID PANEL
Cholesterol: 193 mg/dL (ref <200)
HDL: 78 mg/dL (ref >=50)
LDL Cholesterol (Calc): 100 mg/dL (calc) — ABNORMAL HIGH
Non-HDL Cholesterol (Calc): 115 mg/dL (calc) (ref <130)
Total CHOL/HDL Ratio: 2.5 (calc) (ref <5.0)
Triglycerides: 67 mg/dL (ref <150)

## 2022-12-15 LAB — CBC
HCT: 43 % (ref 35.0–45.0)
Hemoglobin: 14.2 g/dL (ref 11.7–15.5)
MCH: 31.7 pg (ref 27.0–33.0)
MCHC: 33 g/dL (ref 32.0–36.0)
MCV: 96 fL (ref 80.0–100.0)
MPV: 10.7 fL (ref 7.5–12.5)
Platelets: 240 10*3/uL (ref 140–400)
RBC: 4.48 10*6/uL (ref 3.80–5.10)
RDW: 11.8 % (ref 11.0–15.0)
WBC: 5.2 10*3/uL (ref 3.8–10.8)

## 2022-12-15 LAB — COMPLETE METABOLIC PANEL WITH GFR
AG Ratio: 1.9 (calc) (ref 1.0–2.5)
ALT: 34 U/L — ABNORMAL HIGH (ref 6–29)
AST: 15 U/L (ref 10–30)
Albumin: 4.8 g/dL (ref 3.6–5.1)
Alkaline phosphatase (APISO): 72 U/L (ref 31–125)
BUN: 15 mg/dL (ref 7–25)
CO2: 25 mmol/L (ref 20–32)
Calcium: 10.1 mg/dL (ref 8.6–10.2)
Chloride: 103 mmol/L (ref 98–110)
Creat: 0.71 mg/dL (ref 0.50–0.96)
Globulin: 2.5 g/dL (calc) (ref 1.9–3.7)
Glucose, Bld: 87 mg/dL (ref 65–99)
Potassium: 4.1 mmol/L (ref 3.5–5.3)
Sodium: 139 mmol/L (ref 135–146)
Total Bilirubin: 0.6 mg/dL (ref 0.2–1.2)
Total Protein: 7.3 g/dL (ref 6.1–8.1)
eGFR: 124 mL/min/{1.73_m2} (ref >=60)

## 2022-12-15 LAB — HEMOGLOBIN A1C
Hgb A1c MFr Bld: 5.3 % of total Hgb (ref <5.7)
Mean Plasma Glucose: 105 mg/dL
eAG (mmol/L): 5.8 mmol/L

## 2022-12-15 LAB — HEPATITIS C ANTIBODY: Hepatitis C Ab: NONREACTIVE

## 2022-12-15 LAB — HIV ANTIBODY (ROUTINE TESTING W REFLEX): HIV 1&2 Ab, 4th Generation: NONREACTIVE

## 2022-12-15 LAB — RPR: RPR Ser Ql: NONREACTIVE

## 2022-12-16 LAB — CYTOLOGY - PAP
Chlamydia: NEGATIVE
Comment: NEGATIVE
Comment: NEGATIVE
Comment: NORMAL
Diagnosis: NEGATIVE
Neisseria Gonorrhea: NEGATIVE
Trichomonas: NEGATIVE

## 2023-02-02 ENCOUNTER — Ambulatory Visit: Payer: MEDICAID | Admitting: Internal Medicine

## 2023-02-02 ENCOUNTER — Ambulatory Visit: Payer: Self-pay

## 2023-02-02 NOTE — Telephone Encounter (Signed)
     Chief Complaint: Lower abdominal pain, seen in ED."They said I have a fibroid." Still having pain that comes and goes. Symptoms: Above Frequency: Last week Pertinent Negatives: Patient denies fever Disposition: [] ED /[] Urgent Care (no appt availability in office) / [x] Appointment(In office/virtual)/ []  [place] [facility]/ [] Home Care/ [] Refused Recommended Disposition /[] [place] [facility]/ []  Follow-up with PCP Additional Notes: Agrees with appointment.  Reason for Disposition  [1] MODERATE pain (e.g., interferes with normal activities) AND [2] pain comes and goes (cramps) AND [3] present > 24 hours  (Exception: Pain with Vomiting or Diarrhea - see that Guideline.)  Answer Assessment - Initial Assessment Questions 1. LOCATION: "Where does it hurt?"      Lower 2. RADIATION: "Does the pain shoot anywhere else?" (e.g., chest, back)     No 3. ONSET: "When did the pain begin?" (e.g., minutes, hours or days ago)      Last week 4. SUDDEN: "Gradual or sudden onset?"     Gradual 5. PATTERN "Does the pain come and go, or is it constant?"    - If it comes and goes: "How long does it last?" "Do you have pain now?"     (Note: Comes and goes means the pain is intermittent. It goes away completely between bouts.)    - If constant: "Is it getting better, staying the same, or getting worse?"      (Note: Constant means the pain never goes away completely; most serious pain is constant and gets worse.)      Comes and goes 6. SEVERITY: "How bad is the pain?"  (e.g., Scale 1-10; mild, moderate, or severe)    - MILD (1-3): Doesn't interfere with normal activities, abdomen soft and not tender to touch.     - MODERATE (4-7): Interferes with normal activities or awakens from sleep, abdomen tender to touch.     - SEVERE (8-10): Excruciating pain, doubled over, unable to do any normal activities.       7 7. RECURRENT SYMPTOM: "Have you ever had this type of stomach pain before?" If Yes, ask:  "When was the last time?" and "What happened that time?"      Yes 8. CAUSE: "What do you think is causing the stomach pain?"     Uterus  9. RELIEVING/AGGRAVATING FACTORS: "What makes it better or worse?" (e.g., antacids, bending or twisting motion, bowel movement)     None 10. OTHER SYMPTOMS: "Do you have any other symptoms?" (e.g., back pain, diarrhea, fever, urination pain, vomiting)       No 11. PREGNANCY: "Is there any chance you are pregnant?" "When was your last menstrual period?"       No  Protocols used: Abdominal Pain - [facility]

## 2023-02-03 ENCOUNTER — Encounter: Payer: MEDICAID | Admitting: Obstetrics and Gynecology

## 2023-02-03 ENCOUNTER — Ambulatory Visit (INDEPENDENT_AMBULATORY_CARE_PROVIDER_SITE_OTHER): Payer: MEDICAID | Admitting: Obstetrics and Gynecology

## 2023-02-03 ENCOUNTER — Encounter: Payer: Self-pay | Admitting: Obstetrics and Gynecology

## 2023-02-03 ENCOUNTER — Ambulatory Visit: Payer: Self-pay

## 2023-02-03 VITALS — BP 113/73 | HR 72 | Ht 69.0 in | Wt 171.8 lb

## 2023-02-03 DIAGNOSIS — D251 Intramural leiomyoma of uterus: Secondary | ICD-10-CM

## 2023-02-03 DIAGNOSIS — R102 Pelvic and perineal pain: Secondary | ICD-10-CM

## 2023-02-03 NOTE — Progress Notes (Signed)
HPI:      [name] is a 22 y.o. G0P0000 who LMP was Patient's last menstrual period was 01/29/2023 (approximate).  Subjective:   She presents today with continued complaint of lower midline abdominal pain.  She says it does not wax and wane it is present constantly.  She has had this pain for approximately 1 week.  It began 2 days before her cycle.  She reports it was not bad when she was bleeding.  She says she is not bleeding now but continues to have the pain.  She says it does not hurt with intercourse, urination, bowel movements or anything else that she can do.  Nothing relieves the pain and nothing makes it worse. Seen in the emergency department 1 week ago and found to have a very small intramural fibroid.  Otherwise CT and ultrasound were completely negative. Patient has Nexplanon for birth control and has irregular bleeding but this is the first time she had significantly pain. She denies vaginal discharge.    Hx: The following portions of the patient's history were reviewed and updated as appropriate:             She  has a past medical history of Anxiety, GERD (gastroesophageal reflux disease), Kidney infection, and Seizures (HCC). She does not have any pertinent problems on file. She  has a past surgical history that includes No past surgeries. Her family history includes Hypertension in her mother. She  reports that she has never smoked. She has never used smokeless tobacco. She reports current drug use. Drug: Marijuana. She reports that she does not drink alcohol. She has a current medication list which includes the following prescription(s): buspirone, fluvoxamine, ibuprofen, loperamide, NON FORMULARY, and nexplanon. She is allergic to amoxicillin.       Review of Systems:  Review of Systems  Constitutional: Denied constitutional symptoms, night sweats, recent illness, fatigue, fever, insomnia and weight loss.  Eyes: Denied eye symptoms, eye pain, photophobia,  vision change and visual disturbance.  Ears/Nose/Throat/Neck: Denied ear, nose, throat or neck symptoms, hearing loss, nasal discharge, sinus congestion and sore throat.  Cardiovascular: Denied cardiovascular symptoms, arrhythmia, chest pain/pressure, edema, exercise intolerance, orthopnea and palpitations.  Respiratory: Denied pulmonary symptoms, asthma, pleuritic pain, productive sputum, cough, dyspnea and wheezing.  Gastrointestinal: Denied, gastro-esophageal reflux, melena, nausea and vomiting.  Genitourinary: See HPI for additional information.  Musculoskeletal: Denied musculoskeletal symptoms, stiffness, swelling, muscle weakness and myalgia.  Dermatologic: Denied dermatology symptoms, rash and scar.  Neurologic: Denied neurology symptoms, dizziness, headache, neck pain and syncope.  Psychiatric: Denied psychiatric symptoms, anxiety and depression.  Endocrine: Denied endocrine symptoms including hot flashes and night sweats.   Meds:   Current Outpatient Medications on File Prior to Visit  Medication Sig Dispense Refill   busPIRone (BUSPAR) 10 MG tablet Take 1 tablet (10 mg total) by mouth 2 (two) times daily as needed. 180 tablet 0   fluvoxaMINE (LUVOX) 25 MG tablet Take 1 tablet (25 mg total) by mouth at bedtime. 90 tablet 0   ibuprofen (ADVIL) 800 MG tablet Take 1 tablet (800 mg total) by mouth 3 (three) times daily. 21 tablet 0   loperamide (IMODIUM) 2 MG capsule Take 1 capsule (2 mg total) by mouth 4 (four) times daily as needed for diarrhea or loose stools. 12 capsule 0   NON FORMULARY Take 75 mg by mouth daily. CBD Gummy     etonogestrel (NEXPLANON) 68 MG IMPL implant 1 each (68 mg total) by Subdermal route once  for 1 dose. 1 each 0   No current facility-administered medications on file prior to visit.      Objective:     Vitals:   02/03/23 1331  BP: 113/73  Pulse: 72   Filed Weights   02/03/23 1331  Weight: 171 lb 12.8 oz (77.9 kg)              Ultrasound and CT  results reviewed directly with the patient          Assessment:    G0P0000 Patient Active Problem List   Diagnosis Date Noted   Anxiety and depression 03/11/2021   Chronic bilateral low back pain without sciatica 01/26/2020     1. Pelvic pain in female   2. Intramural uterine fibroid     Doubt the fibroid is a source of her pain.  However, I can find no other obvious source of her pain.   Plan:            1.  We have discussed expectant management to see if her signs and symptoms change or if the pain resolves.  Use of high-dose ibuprofen discussed. Orders No orders of the defined types were placed in this encounter.   No orders of the defined types were placed in this encounter.     F/U  Return for Pt to contact us if symptoms worsen. I spent 31 minutes involved in the care of this patient preparing to see the patient by obtaining and reviewing her medical history (including labs, imaging tests and prior procedures), documenting clinical information in the electronic health record (EHR), counseling and coordinating care plans, writing and sending prescriptions, ordering tests or procedures and in direct communicating with the patient and medical staff discussing pertinent items from her history and physical exam.  Elonda Husky, M.D. 02/03/2023 2:13 PM

## 2023-02-03 NOTE — Telephone Encounter (Signed)
Chief Complaint: Abdominal Pain  Symptoms: lower abdominal pain, nausea Frequency: constant Pertinent Negatives: Patient denies vomiting, vaginal bleeding, fever Disposition: [] ED /[] Urgent Care (no appt availability in office) / [x] Appointment(In office/virtual)/ []  [place] [facility]/ [] Home Care/ [] Refused Recommended Disposition /[] [place] [facility]/ []  Follow-up with PCP Additional Notes: Patient stated she was seen at her OBGYN office today and was told that she has a small fibroid but the provider did not believe that it was the cause of her abdominal pain due to the size. Patient reports pain started last week and she assumed it was related to her cycle. Patient reports it continues to hurt. OBGYN advised patient to follow up with PCP. Care advice was given and patient has an appointment scheduled tomorrow. Advised patient to use heating pad and alternate tylenol and ibuprofen for discomfort.   Reason for Disposition  [1] MODERATE pain (e.g., interferes with normal activities) AND [2] pain comes and goes (cramps) AND [3] present > 24 hours  (Exception: Pain with Vomiting or Diarrhea - see that Guideline.)  Answer Assessment - Initial Assessment Questions 1. LOCATION: "Where does it hurt?"      Abdominal pain lower area  2. RADIATION: "Does the pain shoot anywhere else?" (e.g., chest, back)     No 3. ONSET: "When did the pain begin?" (e.g., minutes, hours or days ago)      Last week  4. SUDDEN: "Gradual or sudden onset?"     Sudden 5. PATTERN "Does the pain come and go, or is it constant?"    - If it comes and goes: "How long does it last?" "Do you have pain now?"     (Note: Comes and goes means the pain is intermittent. It goes away completely between bouts.)    - If constant: "Is it getting better, staying the same, or getting worse?"      (Note: Constant means the pain never goes away completely; most serious pain is constant and gets worse.)      Constant  6. SEVERITY:  "How bad is the pain?"  (e.g., Scale 1-10; mild, moderate, or severe)    - MILD (1-3): Doesn't interfere with normal activities, abdomen soft and not tender to touch.     - MODERATE (4-7): Interferes with normal activities or awakens from sleep, abdomen tender to touch.     - SEVERE (8-10): Excruciating pain, doubled over, unable to do any normal activities.       4/10 7. RECURRENT SYMPTOM: "Have you ever had this type of stomach pain before?" If Yes, ask: "When was the last time?" and "What happened that time?"      Yes, maybe about 1 year ago but I don't remember what happened  8. CAUSE: "What do you think is causing the stomach pain?"     Fibroid pain maybe  9. RELIEVING/AGGRAVATING FACTORS: "What makes it better or worse?" (e.g., antacids, bending or twisting motion, bowel movement)     Worse when I cough 10. OTHER SYMPTOMS: "Do you have any other symptoms?" (e.g., back pain, diarrhea, fever, urination pain, vomiting)       Nausea at times, shakey  Protocols used: Abdominal Pain - [facility]

## 2023-02-03 NOTE — Progress Notes (Signed)
Patient presents today to follow up from a recent ED visit. She states last week having severe abdominal pain which led her to the ED, ultrasound preformed showing an intermural fibroid. She reports still having pain, taking tylenol PRN. Patient has a Nexplanon but reports irregular cycles along with heavy bleeding and severe cramping.

## 2023-02-04 ENCOUNTER — Ambulatory Visit (INDEPENDENT_AMBULATORY_CARE_PROVIDER_SITE_OTHER): Payer: MEDICAID | Admitting: Internal Medicine

## 2023-02-04 ENCOUNTER — Encounter: Payer: Self-pay | Admitting: Internal Medicine

## 2023-02-04 VITALS — BP 108/62 | Ht 69.0 in | Wt 172.0 lb

## 2023-02-04 DIAGNOSIS — N83201 Unspecified ovarian cyst, right side: Secondary | ICD-10-CM

## 2023-02-04 DIAGNOSIS — R102 Pelvic and perineal pain: Secondary | ICD-10-CM | POA: Diagnosis not present

## 2023-02-04 DIAGNOSIS — N83202 Unspecified ovarian cyst, left side: Secondary | ICD-10-CM | POA: Diagnosis not present

## 2023-02-04 DIAGNOSIS — D219 Benign neoplasm of connective and other soft tissue, unspecified: Secondary | ICD-10-CM

## 2023-02-04 MED ORDER — KETOROLAC TROMETHAMINE 30 MG/ML IJ SOLN
30.0000 mg | Freq: Once | INTRAMUSCULAR | Status: AC
  Administered 2023-02-04: 30 mg via INTRAMUSCULAR

## 2023-02-04 MED ORDER — BUSPIRONE HCL 10 MG PO TABS: 10.0000 mg | ORAL_TABLET | Freq: Two times a day (BID) | ORAL | 0 refills | Status: DC | PRN

## 2023-02-04 NOTE — Patient Instructions (Signed)

## 2023-02-04 NOTE — Progress Notes (Signed)
Subjective:    Patient ID: Dana Phillips, female    DOB: 02/04/2001, 22 y.o.   MRN: [id number]  HPI  Patient presents to clinic today for ER follow-up.  She presented to the [facility] ER 11/11 with complaint of intermittent right lower quadrant pain x 1 week.  Labs were unremarkable.  Urinalysis was concerning for infection so urine culture was sent and group B strep was isolated.  Pelvic ultrasound did show evidence of a uterine fibroid and multiple bilateral cysts.  CT abdomen/pelvis was unremarkable.  She was discharged advised to take ibuprofen and Tylenol OTC as well as follow-up with GYN.  She followed up with Dr. Logan Bores yesterday, who did not feel like her RLQ abdominal pain was secondary to the uterine fibroid and he advised her to follow-up with her PCP.  She reports persistent pelvic cramping worse on the right and left side as opposed to directly over the bladder/uterus.  Her LMP was 1 week ago.  She is sexually active but not concerned about STDs.  She denies urinary or vaginal symptoms.  She does have a Nexplanon for birth control.  Review of Systems   Past Medical History:  Diagnosis Date   Anxiety    GERD (gastroesophageal reflux disease)    Kidney infection    Seizures (HCC)     Current Outpatient Medications  Medication Sig Dispense Refill   busPIRone (BUSPAR) 10 MG tablet Take 1 tablet (10 mg total) by mouth 2 (two) times daily as needed. 180 tablet 0   etonogestrel (NEXPLANON) 68 MG IMPL implant 1 each (68 mg total) by Subdermal route once for 1 dose. 1 each 0   fluvoxaMINE (LUVOX) 25 MG tablet Take 1 tablet (25 mg total) by mouth at bedtime. 90 tablet 0   ibuprofen (ADVIL) 800 MG tablet Take 1 tablet (800 mg total) by mouth 3 (three) times daily. 21 tablet 0   loperamide (IMODIUM) 2 MG capsule Take 1 capsule (2 mg total) by mouth 4 (four) times daily as needed for diarrhea or loose stools. 12 capsule 0   NON FORMULARY Take 75 mg by mouth daily. CBD Gummy     No current  facility-administered medications for this visit.    Allergies  Allergen Reactions   Amoxicillin Rash    Family History  Problem Relation Age of Onset   Hypertension Mother    Sudden Cardiac Death Neg Hx     Social History   Socioeconomic History   Marital status: Single    Spouse name: Not on file   Number of children: Not on file   Years of education: Not on file   Highest education level: Not on file  Occupational History   Not on file  Tobacco Use   Smoking status: Never   Smokeless tobacco: Never  Vaping Use   Vaping status: Every Day   Substances: Nicotine, Flavoring  Substance and Sexual Activity   Alcohol use: No   Drug use: Yes    Types: Marijuana    Comment: CBD gummies   Sexual activity: Yes    Birth control/protection: Implant  Other Topics Concern   Not on file  Social History Narrative   Not on file   Social Determinants of Health   Financial Resource Strain: Low Risk  (06/06/2018)   Received from [facility], [facility]   Overall Financial Resource Strain (CARDIA)    Difficulty of Paying Living Expenses: Not hard at all  Food Insecurity: No Food  Insecurity (06/06/2018)   Received from [facility], [facility]   Hunger Vital Sign    Worried About Running Out of Food in the Last Year: Never true    Ran Out of Food in the Last Year: Never true  Transportation Needs: No Transportation Needs (06/06/2018)   Received from [facility], [facility]   [facility] - Transportation    Lack of Transportation (Medical): No    Lack of Transportation (Non-Medical): No  Physical Activity: Sufficiently Active (06/06/2018)   Received from [facility], [facility]   Exercise Vital Sign    Days of Exercise per Week: 7 days    Minutes of Exercise per Session: 40 min  Stress: Stress Concern Present (06/06/2018)   Received from [facility], [facility] - Occupational Stress Questionnaire     Feeling of Stress : Rather much  Social Connections: Socially Isolated (06/06/2018)   Received from [facility], [facility]   Social Connection and Isolation Panel [NHANES]    Frequency of Communication with Friends and Family: Once a week    Frequency of Social Gatherings with Friends and Family: Never    Attends Religious Services: Never    Database administrator or Organizations: No    Attends Banker Meetings: Never    Marital Status: Never married  Intimate Partner Violence: Not At Risk (06/06/2018)   Received from [facility], [facility]   Humiliation, Afraid, Rape, and Kick questionnaire    Fear of Current or Ex-Partner: No    Emotionally Abused: No    Physically Abused: No    Sexually Abused: No     Constitutional: Denies fever, malaise, fatigue, headache or abrupt weight changes.  HEENT: Denies eye pain, eye redness, ear pain, ringing in the ears, wax buildup, runny nose, nasal congestion, bloody nose, or sore throat. Respiratory: Denies difficulty breathing, shortness of breath, cough or sputum production.   Cardiovascular: Denies chest pain, chest tightness, palpitations or swelling in the hands or feet.  Gastrointestinal: Patient reports nausea, bilateral lower abdominal pain.  Denies bloating, constipation, diarrhea or blood in the stool.  GU: Denies urgency, frequency, pain with urination, burning sensation, blood in urine, odor or discharge. Musculoskeletal: Denies decrease in range of motion, difficulty with gait, muscle pain or joint pain and swelling.  Skin: Denies redness, rashes, lesions or ulcercations.  Neurological: Denies dizziness, difficulty with memory, difficulty with speech or problems with balance and coordination.  Psych: Denies anxiety, depression, SI/HI.  No other specific complaints in a complete review of systems (except as listed in HPI above).      Objective:   Physical Exam  BP 108/62   Ht 5\' 9"  (1.753 m)    Wt 172 lb (78 kg)   LMP 01/29/2023 (Approximate)   BMI 25.40 kg/m   Wt Readings from Last 3 Encounters:  02/03/23 171 lb 12.8 oz (77.9 kg)  12/14/22 160 lb (72.6 kg)  10/13/22 158 lb 12.8 oz (72 kg)    General: Appears her stated age, overweight, in NAD. Skin: Warm, dry and intact.  HEENT: Head: normal shape and size; Eyes: sclera white, no icterus, conjunctiva pink, PERRLA and EOMs intact; Ears: Tm's gray and intact, normal light reflex; Nose: mucosa pink and moist, septum midline;  Cardiovascular: Normal rate and rhythm. S1,S2 noted.  No murmur, rubs or gallops noted.  Pulmonary/Chest: Normal effort and positive vesicular breath  sounds. No respiratory distress. No wheezes, rales or ronchi noted.  Abdomen: Soft and tender in bilateral lower quadrants. Normal bowel sounds. No distention or masses noted. Liver, spleen and kidneys non palpable. Musculoskeletal:  No difficulty with gait.  Neurological: Alert and oriented.  Coordination normal.    BMET    Component Value Date/Time   NA 139 12/14/2022 0911   K 4.1 12/14/2022 0911   CL 103 12/14/2022 0911   CO2 25 12/14/2022 0911   GLUCOSE 87 12/14/2022 0911   BUN 15 12/14/2022 0911   CREATININE 0.71 12/14/2022 0911   CALCIUM 10.1 12/14/2022 0911   GFRNONAA >60 02/12/2020 1026   GFRNONAA SEE NOTE 07/22/2016 1157   GFRAA NOT CALCULATED 08/01/2019 2057   GFRAA SEE NOTE 07/22/2016 1157    Lipid Panel     Component Value Date/Time   CHOL 193 12/14/2022 0911   TRIG 67 12/14/2022 0911   HDL 78 12/14/2022 0911   CHOLHDL 2.5 12/14/2022 0911   LDLCALC 100 (H) 12/14/2022 0911    CBC    Component Value Date/Time   WBC 5.2 12/14/2022 0911   RBC 4.48 12/14/2022 0911   HGB 14.2 12/14/2022 0911   HCT 43.0 12/14/2022 0911   PLT 240 12/14/2022 0911   MCV 96.0 12/14/2022 0911   MCH 31.7 12/14/2022 0911   MCHC 33.0 12/14/2022 0911   RDW 11.8 12/14/2022 0911   LYMPHSABS 2,344 10/05/2019 1559   MONOABS 0.8 08/01/2019 2057   EOSABS  180 10/05/2019 1559   BASOSABS 19 10/05/2019 1559    Hgb A1C Lab Results  Component Value Date   HGBA1C 5.3 12/14/2022            Assessment & Plan:   ER follow-up for pelvic pain, nausea:    New onset of bilateral pelvic pain, worse on the right, with associated cramping and recent heavy menstrual bleeding. Pain started prior to last menstrual period and has persisted. Recent imaging showed a small uterine fibroid and ovarian cysts. No urinary or vaginal symptoms. No bowel symptoms. -Administer Toradol injection for pain relief. -Recommend removal of Nexplanon and switch to oral contraceptive for better management of ovarian cysts. -Advise patient to use ibuprofen and heating pad for symptomatic relief. -Referral to Gynecology for further evaluation and management.     RTC in 10 months for your annual exam Nicki Reaper, NP  There are no diagnoses linked to this encounter.

## 2023-02-16 ENCOUNTER — Other Ambulatory Visit: Payer: Self-pay | Admitting: Family Medicine

## 2023-02-16 DIAGNOSIS — F32A Depression, unspecified: Secondary | ICD-10-CM

## 2023-02-16 MED ORDER — BUSPIRONE HCL 10 MG PO TABS: 10.0000 mg | ORAL_TABLET | Freq: Two times a day (BID) | ORAL | 0 refills | Status: AC | PRN

## 2023-02-16 MED ORDER — FLUVOXAMINE MALEATE 25 MG PO TABS: 25.0000 mg | ORAL_TABLET | Freq: Every day | ORAL | 0 refills | Status: DC

## 2023-03-22 ENCOUNTER — Ambulatory Visit
Admission: EM | Admit: 2023-03-22 | Discharge: 2023-03-22 | Disposition: A | Discharge status: Home / Self Care | Payer: MEDICAID | Attending: Emergency Medicine | Admitting: Emergency Medicine

## 2023-03-22 DIAGNOSIS — J019 Acute sinusitis, unspecified: Secondary | ICD-10-CM | POA: Diagnosis not present

## 2023-03-22 DIAGNOSIS — J069 Acute upper respiratory infection, unspecified: Secondary | ICD-10-CM

## 2023-03-22 MED ORDER — FLUTICASONE PROPIONATE 50 MCG/ACT NA SUSP: 2.0000 | Freq: Every day | NASAL | 0 refills | Status: AC

## 2023-03-22 MED ORDER — PROMETHAZINE-DM 6.25-15 MG/5ML PO SYRP: 5.0000 mL | ORAL_SOLUTION | Freq: Four times a day (QID) | ORAL | 0 refills | Status: DC | PRN

## 2023-03-22 MED ORDER — DOXYCYCLINE HYCLATE 100 MG PO CAPS: 100.0000 mg | ORAL_CAPSULE | Freq: Two times a day (BID) | ORAL | 0 refills | Status: AC

## 2023-03-22 MED ORDER — AEROCHAMBER MV MISC: 1 refills | Status: DC

## 2023-03-22 MED ORDER — ALBUTEROL SULFATE HFA 108 (90 BASE) MCG/ACT IN AERS: 1.0000 | INHALATION_SPRAY | RESPIRATORY_TRACT | 0 refills | Status: DC | PRN

## 2023-03-22 NOTE — ED Triage Notes (Signed)
Pt has had intermittent cough and nasal congestion x two weeks. Started having hoarse voice yesterday.  Boyfriend had COVID two weeks ago and pt tested at home but was negative.  Denies any other s/s.  Has tried Mucinex, Tylenol cold and flu, Aleve, etc.

## 2023-03-22 NOTE — ED Provider Notes (Incomplete)
HPI  SUBJECTIVE:  Dana Phillips is a 22 y.o. female who presents with ***  Patient has a past medical history of GERD, seizures  Past Medical History:  Diagnosis Date   Anxiety    GERD (gastroesophageal reflux disease)    Kidney infection    Seizures (HCC)     Past Surgical History:  Procedure Laterality Date   NO PAST SURGERIES     WISDOM TOOTH EXTRACTION      Family History  Problem Relation Age of Onset   Hypertension Mother    Sudden Cardiac Death Neg Hx     Social History   Tobacco Use   Smoking status: Never   Smokeless tobacco: Never  Vaping Use   Vaping status: Every Day   Substances: Nicotine, Flavoring  Substance Use Topics   Alcohol use: No   Drug use: Yes    Types: Marijuana    Comment: CBD gummies    No current facility-administered medications for this encounter.  Current Outpatient Medications:    albuterol (VENTOLIN HFA) 108 (90 Base) MCG/ACT inhaler, Inhale 1-2 puffs into the lungs every 4 (four) hours as needed for wheezing or shortness of breath., Disp: 1 each, Rfl: 0   busPIRone (BUSPAR) 10 MG tablet, Take 1 tablet (10 mg total) by mouth 2 (two) times daily as needed., Disp: 180 tablet, Rfl: 0   doxycycline (VIBRAMYCIN) 100 MG capsule, Take 1 capsule (100 mg total) by mouth 2 (two) times daily for 10 days., Disp: 20 capsule, Rfl: 0   etonogestrel (NEXPLANON) 68 MG IMPL implant, 1 each (68 mg total) by Subdermal route once for 1 dose., Disp: 1 each, Rfl: 0   fluticasone (FLONASE) 50 MCG/ACT nasal spray, Place 2 sprays into both nostrils daily., Disp: 16 g, Rfl: 0   fluvoxaMINE (LUVOX) 25 MG tablet, Take 1 tablet (25 mg total) by mouth at bedtime., Disp: 90 tablet, Rfl: 0   ibuprofen (ADVIL) 800 MG tablet, Take 1 tablet (800 mg total) by mouth 3 (three) times daily., Disp: 21 tablet, Rfl: 0   loperamide (IMODIUM) 2 MG capsule, Take 1 capsule (2 mg total) by mouth 4 (four) times daily as needed for diarrhea or loose stools., Disp: 12 capsule,  Rfl: 0   NON FORMULARY, Take 75 mg by mouth daily. CBD Gummy, Disp: , Rfl:    promethazine-dextromethorphan (PROMETHAZINE-DM) 6.25-15 MG/5ML syrup, Take 5 mLs by mouth 4 (four) times daily as needed for cough., Disp: 118 mL, Rfl: 0   Spacer/Aero-Holding Chambers (AEROCHAMBER MV) inhaler, Use as instructed, Disp: 1 each, Rfl: 1  Allergies  Allergen Reactions   Amoxicillin Rash     ROS  As noted in HPI.   Physical Exam  BP 111/76 (BP Location: Right Arm)   Pulse 83   Temp 98.4 F (36.9 C) (Oral)   Resp 19   SpO2 98%   Constitutional: Well developed, well nourished, no acute distress.  Laryngitis. Eyes:  EOMI, conjunctiva normal bilaterally HENT: Normocephalic, atraumatic,mucus membranes moist.  Clear nasal congestion.  No tenderness.  No maxillary, frontal sinus tenderness.  Positive cobblestoning and postnasal drip. Respiratory: Normal inspiratory effort, lungs clear bilaterally, good air movement. Cardiovascular: Normal rate, regular rhythm, no murmurs rubs or gallops.  No anterior, lateral chest wall tenderness. GI: nondistended skin: No rash, skin intact Musculoskeletal: no deformities Neurologic: Alert & oriented x 3, no focal neuro deficits Psychiatric: Speech and behavior appropriate   ED Course   Medications - No data to display  No orders of  the defined types were placed in this encounter.   No results found for this or any previous visit (from the past 24 hours). No results found.  ED Clinical Impression  1. Acute non-recurrent sinusitis, unspecified location   2. Upper respiratory tract infection, unspecified type      ED Assessment/Plan   {The patient has been seen in Urgent Care in the last 3 years. :1}  Presents with a URI that I suspect she has turned into a secondary sinus infection.  She qualifies for antibiotics given duration of symptoms and history of double sickening.  Home with Flonase, saline nasal rogation, Promethazine DM, Mucinex D,  doxycycline for 10 days as she reports urticaria with amoxicillin.  Discontinue allergy medication, albuterol inhaler with a spacer 2 puffs every 4-6 hours as needed.  Lungs are clear, satting well on room air, doubt pneumonia, deferring chest x-ray.  Work note for today and tomorrow.  Discussed  MDM, treatment plan, and plan for follow-up with patient. Discussed sn/sx that should prompt return to the ED. patient agrees with plan.   Meds ordered this encounter  Medications   doxycycline (VIBRAMYCIN) 100 MG capsule    Sig: Take 1 capsule (100 mg total) by mouth 2 (two) times daily for 10 days.    Dispense:  20 capsule    Refill:  0   fluticasone (FLONASE) 50 MCG/ACT nasal spray    Sig: Place 2 sprays into both nostrils daily.    Dispense:  16 g    Refill:  0   albuterol (VENTOLIN HFA) 108 (90 Base) MCG/ACT inhaler    Sig: Inhale 1-2 puffs into the lungs every 4 (four) hours as needed for wheezing or shortness of breath.    Dispense:  1 each    Refill:  0   Spacer/Aero-Holding Chambers (AEROCHAMBER MV) inhaler    Sig: Use as instructed    Dispense:  1 each    Refill:  1   promethazine-dextromethorphan (PROMETHAZINE-DM) 6.25-15 MG/5ML syrup    Sig: Take 5 mLs by mouth 4 (four) times daily as needed for cough.    Dispense:  118 mL    Refill:  0      *This clinic note was created using Scientist, clinical (histocompatibility and immunogenetics). Therefore, there may be occasional mistakes despite careful proofreading.  ?

## 2023-03-22 NOTE — Discharge Instructions (Signed)
Start Mucinex-D to keep the mucous thin and to decongest you.  Finish the doxycycline, even if you feel better.  Flonase will help with the nasal congestion.  Promethazine DM as needed for cough.  Use a NeilMed sinus rinse with distilled water as often as you want to to reduce nasal congestion. Follow the directions on the box.   2 puffs from your albuterol inhaler using your spacer every 4-6 hours as needed for coughing, wheezing.  Go to www.goodrx.com to look up your medications. This will give you a list of where you can find your prescriptions at the most affordable prices. Or you can ask the pharmacist what the cash price is. This is frequently cheaper than going through insurance.

## 2023-05-14 ENCOUNTER — Other Ambulatory Visit: Payer: Self-pay | Admitting: Family Medicine

## 2023-05-14 ENCOUNTER — Encounter: Payer: Self-pay | Admitting: Emergency Medicine

## 2023-05-14 ENCOUNTER — Ambulatory Visit
Admission: EM | Admit: 2023-05-14 | Discharge: 2023-05-14 | Disposition: A | Discharge status: Home / Self Care | Payer: MEDICAID | Attending: Emergency Medicine | Admitting: Emergency Medicine

## 2023-05-14 DIAGNOSIS — F419 Anxiety disorder, unspecified: Secondary | ICD-10-CM

## 2023-05-14 DIAGNOSIS — J069 Acute upper respiratory infection, unspecified: Secondary | ICD-10-CM | POA: Insufficient documentation

## 2023-05-14 LAB — RESP PANEL BY RT-PCR (FLU A&B, COVID) ARPGX2
Influenza A by PCR: NEGATIVE
Influenza B by PCR: NEGATIVE
SARS Coronavirus 2 by RT PCR: NEGATIVE

## 2023-05-14 LAB — GROUP A STREP BY PCR: Group A Strep by PCR: NOT DETECTED

## 2023-05-14 MED ORDER — IPRATROPIUM BROMIDE 0.06 % NA SOLN: 2.0000 | Freq: Four times a day (QID) | NASAL | 12 refills | Status: DC

## 2023-05-14 MED ORDER — PROMETHAZINE-DM 6.25-15 MG/5ML PO SYRP: 5.0000 mL | ORAL_SOLUTION | Freq: Four times a day (QID) | ORAL | 0 refills | Status: DC | PRN

## 2023-05-14 MED ORDER — BENZONATATE 100 MG PO CAPS: 200.0000 mg | ORAL_CAPSULE | Freq: Three times a day (TID) | ORAL | 0 refills | Status: DC

## 2023-05-14 NOTE — ED Provider Notes (Signed)
[facility]    CSN: [id number] Arrival date & time: 05/14/23  4098      History   Chief Complaint Chief Complaint  Patient presents with   Sore Throat    HPI Dana Phillips is a 23 y.o. female.   HPI  23 year old female with a past medical history significant for seizures, GERD, and anxiety presents for evaluation of flulike symptoms that started yesterday.  Most prominent puncture symptoms as a sore throat that was followed by a headache.  She also Dors is runny nose, nasal congestion, and a nonproductive cough.  She denies fever, ear pain, shortness breath, wheezing, vomiting, or diarrhea.  No known sick contacts or recent travel although she does work at a [employer].  Past Medical History:  Diagnosis Date   Anxiety    GERD (gastroesophageal reflux disease)    Kidney infection    Seizures (HCC)     Patient Active Problem List   Diagnosis Date Noted   Anxiety and depression 03/11/2021   Chronic bilateral low back pain without sciatica 01/26/2020    Past Surgical History:  Procedure Laterality Date   NO PAST SURGERIES     WISDOM TOOTH EXTRACTION      OB History     Gravida  0   Para  0   Term  0   Preterm  0   AB  0   Living  0      SAB  0   IAB  0   Ectopic  0   Multiple  0   Live Births  0            Home Medications    Prior to Admission medications   Medication Sig Start Date End Date Taking? Authorizing Provider  benzonatate (TESSALON) 100 MG capsule Take 2 capsules (200 mg total) by mouth every 8 (eight) hours. 05/14/23  Yes Becky Augusta, NP  ipratropium (ATROVENT) 0.06 % nasal spray Place 2 sprays into both nostrils 4 (four) times daily. 05/14/23  Yes Becky Augusta, NP  promethazine-dextromethorphan (PROMETHAZINE-DM) 6.25-15 MG/5ML syrup Take 5 mLs by mouth 4 (four) times daily as needed. 05/14/23  Yes Becky Augusta, NP  albuterol (VENTOLIN HFA) 108 (90 Base) MCG/ACT inhaler Inhale 1-2 puffs into the lungs every 4  (four) hours as needed for wheezing or shortness of breath. 03/22/23   Domenick Gong, MD  busPIRone (BUSPAR) 10 MG tablet Take 1 tablet (10 mg total) by mouth 2 (two) times daily as needed. 02/16/23   Karamalegos, Netta Neat, DO  etonogestrel (NEXPLANON) 68 MG IMPL implant 1 each (68 mg total) by Subdermal route once for 1 dose. 01/09/21   Copland, Helmut Muster B, PA-C  fluticasone (FLONASE) 50 MCG/ACT nasal spray Place 2 sprays into both nostrils daily. 03/22/23   Domenick Gong, MD  fluvoxaMINE (LUVOX) 25 MG tablet Take 1 tablet (25 mg total) by mouth at bedtime. 02/16/23   Karamalegos, Netta Neat, DO  ibuprofen (ADVIL) 800 MG tablet Take 1 tablet (800 mg total) by mouth 3 (three) times daily. 07/14/22   Valinda Hoar, NP  loperamide (IMODIUM) 2 MG capsule Take 1 capsule (2 mg total) by mouth 4 (four) times daily as needed for diarrhea or loose stools. 04/06/22   Valinda Hoar, NP  NON FORMULARY Take 75 mg by mouth daily. CBD Gummy    [provider]    Family History Family History  Problem Relation Age of Onset   Hypertension Mother  Sudden Cardiac Death Neg Hx     Social History Social History   Tobacco Use   Smoking status: Never   Smokeless tobacco: Never  Vaping Use   Vaping status: Every Day   Substances: Nicotine, Flavoring  Substance Use Topics   Alcohol use: No   Drug use: Yes    Types: Marijuana    Comment: CBD gummies     Allergies   Amoxicillin   Review of Systems Review of Systems  Constitutional:  Negative for fever.  HENT:  Positive for congestion, rhinorrhea and sore throat. Negative for ear pain.   Respiratory:  Positive for cough. Negative for shortness of breath and wheezing.   Gastrointestinal:  Positive for nausea. Negative for diarrhea and vomiting.  Neurological:  Positive for headaches.     Physical Exam Triage Vital Signs ED Triage Vitals  Encounter Vitals Group     BP      Systolic BP Percentile      Diastolic BP  Percentile      Pulse      Resp      Temp      Temp src      SpO2      Weight      Height      Head Circumference      Peak Flow      Pain Score      Pain Loc      Pain Education      Exclude from Growth Chart    No data found.  Updated Vital Signs BP 129/71 (BP Location: Left Arm)   Pulse 79   Temp 98.5 F (36.9 C) (Oral)   Resp 14   Ht 5\' 9"  (1.753 m)   Wt 171 lb 15.3 oz (78 kg)   SpO2 100%   BMI 25.39 kg/m   Visual Acuity Right Eye Distance:   Left Eye Distance:   Bilateral Distance:    Right Eye Near:   Left Eye Near:    Bilateral Near:     Physical Exam Vitals and nursing note reviewed.  Constitutional:      Appearance: Normal appearance. She is not ill-appearing.  HENT:     Head: Normocephalic and atraumatic.     Right Ear: Tympanic membrane, ear canal and external ear normal. There is no impacted cerumen.     Left Ear: Tympanic membrane, ear canal and external ear normal. There is no impacted cerumen.     Nose: Congestion and rhinorrhea present.     Comments: Nasal mucosa is edematous and mildly erythematous with scant clear discharge in both nares.    Mouth/Throat:     Mouth: Mucous membranes are moist.     Pharynx: Oropharynx is clear. Posterior oropharyngeal erythema present. No oropharyngeal exudate.     Comments: Tonsillar pillars are unremarkable.  Posterior oropharynx demonstrates erythema and injection with clear postnasal drip. Cardiovascular:     Rate and Rhythm: Normal rate and regular rhythm.     Pulses: Normal pulses.     Heart sounds: Normal heart sounds. No murmur heard.    No friction rub. No gallop.  Pulmonary:     Effort: Pulmonary effort is normal.     Breath sounds: Normal breath sounds. No wheezing, rhonchi or rales.  Musculoskeletal:     Cervical back: Normal range of motion and neck supple. No tenderness.  Lymphadenopathy:     Cervical: No cervical adenopathy.  Skin:    General: Skin is warm and dry.  Capillary Refill:  Capillary refill takes less than 2 seconds.     Findings: No rash.  Neurological:     General: No focal deficit present.     Mental Status: She is alert and oriented to person, place, and time.      UC Treatments / Results  Labs (all labs ordered are listed, but only abnormal results are displayed) Labs Reviewed  GROUP A STREP BY PCR  RESP PANEL BY RT-PCR (FLU A&B, COVID) [id number]    EKG   Radiology No results found.  Procedures Procedures (including critical care time)  Medications Ordered in UC Medications - No data to display  Initial Impression / Assessment and Plan / UC Course  I have reviewed the triage vital signs and the nursing notes.  Pertinent labs & imaging results that were available during my care of the patient were reviewed by me and considered in my medical decision making (see chart for details).   Patient is a nontoxic-appearing 23 year old female presenting for evaluation of respiratory symptoms as outlined HPI above.  As stated above, her most concerning symptom is her sore throat and headache.  She does have erythema to her posterior pharynx with clear postnasal drip but her tonsillar pillars are unremarkable.  I will order a strep PCR.  Additionally, she has inflammation of her nasal mucosa with clear nasal discharge.  Otoscopic exam is benign.  No cervical lymphadenopathy on exam.  Cardiopulmonary exam reveals clear lung sounds in all fields.  The differential diagnosis include COVID, influenza, viral respiratory illness.  I will order a COVID and flu PCR.  Strep PCR is negative.  Respiratory panel is negative for COVID or influenza.  I will discharge patient with a diagnosis of viral URI with a cough with a prescription for Atrovent nasal spray, Tessalon Perles, and Promethazine DM cough syrup.  She should use over-the-counter Tylenol and/or ibuprofen according to pack instructions as needed for any fever or pain.  For her throat she may gargle with warm  salt water or use over-the-counter Chloraseptic or Sucrets lozenges to help soothe her throat.  Return precautions reviewed.  Work note provided.   Final Clinical Impressions(s) / UC Diagnoses   Final diagnoses:  Viral URI with cough     Discharge Instructions      Your testing today was negative for COVID, influenza, and strep.  Based on your physical exam I do believe you have a viral respiratory infection that is causing your symptoms.  Use over-the-counter Tylenol and/or ibuprofen according to pack instructions as needed for any fever or pain.  To soothe your throat you may gargle with warm salt water as often as you like.  Mix 1 tablespoon of table salt in 8 ounces of warm water, gargle and spit.  You may also use over-the-counter Chloraseptic or Sucrets lozenges.  No more than 1 lozenge every 2 hours as the menthol may give you diarrhea.  Use the Atrovent nasal spray, 2 squirts in each nostril every 6 hours, as needed for runny nose and postnasal drip.  Use the Tessalon Perles every 8 hours during the day.  Take them with a small sip of water.  They may give you some numbness to the base of your tongue or a metallic taste in your mouth, this is normal.  Use the Promethazine DM cough syrup at bedtime for cough and congestion.  It will make you drowsy so do not take it during the day.  Return for reevaluation or see your  primary care provider for any new or worsening symptoms.      ED Prescriptions     Medication Sig Dispense Auth. Provider   benzonatate (TESSALON) 100 MG capsule Take 2 capsules (200 mg total) by mouth every 8 (eight) hours. 21 capsule Becky Augusta, NP   ipratropium (ATROVENT) 0.06 % nasal spray Place 2 sprays into both nostrils 4 (four) times daily. 15 mL Becky Augusta, NP   promethazine-dextromethorphan (PROMETHAZINE-DM) 6.25-15 MG/5ML syrup Take 5 mLs by mouth 4 (four) times daily as needed. 118 mL Becky Augusta, NP      PDMP not reviewed this  encounter.   Becky Augusta, NP 05/14/23 [phone]

## 2023-05-14 NOTE — Discharge Instructions (Signed)
Your testing today was negative for COVID, influenza, and strep.  Based on your physical exam I do believe you have a viral respiratory infection that is causing your symptoms.  Use over-the-counter Tylenol and/or ibuprofen according to pack instructions as needed for any fever or pain.  To soothe your throat you may gargle with warm salt water as often as you like.  Mix 1 tablespoon of table salt in 8 ounces of warm water, gargle and spit.  You may also use over-the-counter Chloraseptic or Sucrets lozenges.  No more than 1 lozenge every 2 hours as the menthol may give you diarrhea.  Use the Atrovent nasal spray, 2 squirts in each nostril every 6 hours, as needed for runny nose and postnasal drip.  Use the Tessalon Perles every 8 hours during the day.  Take them with a small sip of water.  They may give you some numbness to the base of your tongue or a metallic taste in your mouth, this is normal.  Use the Promethazine DM cough syrup at bedtime for cough and congestion.  It will make you drowsy so do not take it during the day.  Return for reevaluation or see your primary care provider for any new or worsening symptoms.

## 2023-05-14 NOTE — Telephone Encounter (Signed)
Requested Prescriptions  Pending Prescriptions Disp Refills   fluvoxaMINE (LUVOX) 25 MG tablet [Pharmacy Med Name: FLUVOXAMINE 25MG  TABLETS] 90 tablet 0    Sig: TAKE 1 TABLET(25 MG) BY MOUTH AT BEDTIME     Psychiatry:  Antidepressants - SSRI Passed - 05/14/2023  3:58 PM      Passed - Completed PHQ-2 or PHQ-9 in the last 360 days      Passed - Valid encounter within last 6 months    Recent Outpatient Visits           3 months ago Pelvic pain   [facility] [place], Salvadore Oxford, NP   5 months ago Encounter for general adult medical examination w/o abnormal findings   [facility] [place], Salvadore Oxford, NP   7 months ago Menorrhagia with regular cycle   [facility] [place], Salvadore Oxford, NP   2 years ago Mood disorder [facility]   [facility] [place], Salvadore Oxford, NP   2 years ago Decreased hearing of both ears   [facility] [place], Salvadore Oxford, NP       Future Appointments             In 1 month Baity, Salvadore Oxford, NP [facility], [facility]

## 2023-05-14 NOTE — ED Triage Notes (Signed)
Patient c/o sore throat and headache that started yesterday.  Patient unsure of fevers.

## 2023-05-19 ENCOUNTER — Ambulatory Visit
Admission: EM | Admit: 2023-05-19 | Discharge: 2023-05-19 | Disposition: A | Discharge status: Home / Self Care | Payer: MEDICAID | Attending: Emergency Medicine | Admitting: Emergency Medicine

## 2023-05-19 ENCOUNTER — Encounter: Payer: Self-pay | Admitting: Emergency Medicine

## 2023-05-19 DIAGNOSIS — J069 Acute upper respiratory infection, unspecified: Secondary | ICD-10-CM

## 2023-05-19 MED ORDER — PROMETHAZINE-DM 6.25-15 MG/5ML PO SYRP: 5.0000 mL | ORAL_SOLUTION | Freq: Four times a day (QID) | ORAL | 0 refills | Status: DC | PRN

## 2023-05-19 MED ORDER — BENZONATATE 100 MG PO CAPS: 200.0000 mg | ORAL_CAPSULE | Freq: Three times a day (TID) | ORAL | 0 refills | Status: DC

## 2023-05-19 MED ORDER — DOXYCYCLINE HYCLATE 100 MG PO CAPS: 100.0000 mg | ORAL_CAPSULE | Freq: Two times a day (BID) | ORAL | 0 refills | Status: AC

## 2023-05-19 NOTE — Discharge Instructions (Signed)
 Take the doxycycline 100 mg twice daily with food for 7 days for treatment of your upper respiratory infection.  Continue to use your albuterol inhaler as needed for any shortness breath or wheezing.  1 to 2 puffs every 4-6 hours.  Use over-the-counter Tylenol and/or ibuprofen according to pack instructions as needed for any fever or pain.  Use the Atrovent nasal spray, 2 squirts in each nostril every 6 hours, as needed for runny nose and postnasal drip.  Use the Tessalon Perles every 8 hours during the day.  Take them with a small sip of water.  They may give you some numbness to the base of your tongue or a metallic taste in your mouth, this is normal.  Use the Promethazine DM cough syrup at bedtime for cough and congestion.  It will make you drowsy so do not take it during the day.  Return for reevaluation or see your primary care provider for any new or worsening symptoms.

## 2023-05-19 NOTE — ED Triage Notes (Signed)
 Pt presents with a cough, headache, hoarse and congestion x 6 days. Pt was seen 05/14/23 and she is not better.

## 2023-05-19 NOTE — ED Provider Notes (Signed)
 [facility]    CSN: [id number] Arrival date & time: 05/19/23  0801      History   Chief Complaint Chief Complaint  Patient presents with   Cough   Headache   Nasal Congestion   Hoarse    HPI Dana Phillips is a 23 y.o. female.   HPI  23 year old female with past medical history significant for seizures, kidney infections, GERD, and anxiety presents for evaluation of worsening respiratory symptoms over the last week.  She was seen on 05/14/2023 and had negative strep, flu, and COVID testing.  Her respiratory panel did come back incidentally positive for RSV.  She reports that she is continuing to have nasal congestion with thick nasal discharge, cough is intermittently productive, and she has become hoarse over the last couple of days.  She is not running any fevers.  Past Medical History:  Diagnosis Date   Anxiety    GERD (gastroesophageal reflux disease)    Kidney infection    Seizures (HCC)     Patient Active Problem List   Diagnosis Date Noted   Anxiety and depression 03/11/2021   Chronic bilateral low back pain without sciatica 01/26/2020    Past Surgical History:  Procedure Laterality Date   NO PAST SURGERIES     WISDOM TOOTH EXTRACTION      OB History     Gravida  0   Para  0   Term  0   Preterm  0   AB  0   Living  0      SAB  0   IAB  0   Ectopic  0   Multiple  0   Live Births  0            Home Medications    Prior to Admission medications   Medication Sig Start Date End Date Taking? Authorizing Provider  doxycycline (VIBRAMYCIN) 100 MG capsule Take 1 capsule (100 mg total) by mouth 2 (two) times daily for 7 days. 05/19/23 05/26/23 Yes Becky Augusta, NP  albuterol (VENTOLIN HFA) 108 (90 Base) MCG/ACT inhaler Inhale 1-2 puffs into the lungs every 4 (four) hours as needed for wheezing or shortness of breath. 03/22/23   Domenick Gong, MD  benzonatate (TESSALON) 100 MG capsule Take 2 capsules (200 mg total) by  mouth every 8 (eight) hours. 05/19/23   Becky Augusta, NP  busPIRone (BUSPAR) 10 MG tablet Take 1 tablet (10 mg total) by mouth 2 (two) times daily as needed. 02/16/23   Karamalegos, Netta Neat, DO  etonogestrel (NEXPLANON) 68 MG IMPL implant 1 each (68 mg total) by Subdermal route once for 1 dose. 01/09/21   Copland, Helmut Muster B, PA-C  fluticasone (FLONASE) 50 MCG/ACT nasal spray Place 2 sprays into both nostrils daily. 03/22/23   Domenick Gong, MD  fluvoxaMINE (LUVOX) 25 MG tablet TAKE 1 TABLET(25 MG) BY MOUTH AT BEDTIME 05/14/23   Lorre Munroe, NP  ibuprofen (ADVIL) 800 MG tablet Take 1 tablet (800 mg total) by mouth 3 (three) times daily. 07/14/22   White, Elita Boone, NP  ipratropium (ATROVENT) 0.06 % nasal spray Place 2 sprays into both nostrils 4 (four) times daily. 05/14/23   Becky Augusta, NP  loperamide (IMODIUM) 2 MG capsule Take 1 capsule (2 mg total) by mouth 4 (four) times daily as needed for diarrhea or loose stools. 04/06/22   Valinda Hoar, NP  NON FORMULARY Take 75 mg by mouth daily. CBD Gummy    [provider]  promethazine-dextromethorphan (PROMETHAZINE-DM) 6.25-15 MG/5ML syrup Take 5 mLs by mouth 4 (four) times daily as needed. 05/19/23   Becky Augusta, NP    Family History Family History  Problem Relation Age of Onset   Hypertension Mother    Sudden Cardiac Death Neg Hx     Social History Social History   Tobacco Use   Smoking status: Never   Smokeless tobacco: Never  Vaping Use   Vaping status: Every Day   Substances: Nicotine, Flavoring  Substance Use Topics   Alcohol use: No   Drug use: Yes    Types: Marijuana    Comment: CBD gummies     Allergies   Amoxicillin   Review of Systems Review of Systems  Constitutional:  Negative for fever.  HENT:  Positive for congestion, postnasal drip, rhinorrhea and voice change.   Respiratory:  Positive for cough and shortness of breath. Negative for wheezing.      Physical Exam Triage Vital Signs ED  Triage Vitals  Encounter Vitals Group     BP      Systolic BP Percentile      Diastolic BP Percentile      Pulse      Resp      Temp      Temp src      SpO2      Weight      Height      Head Circumference      Peak Flow      Pain Score      Pain Loc      Pain Education      Exclude from Growth Chart    No data found.  Updated Vital Signs BP 119/77 (BP Location: Left Arm)   Pulse 79   Temp 98.4 F (36.9 C) (Oral)   Resp 16   SpO2 99%   Visual Acuity Right Eye Distance:   Left Eye Distance:   Bilateral Distance:    Right Eye Near:   Left Eye Near:    Bilateral Near:     Physical Exam Vitals and nursing note reviewed.  Constitutional:      Appearance: Normal appearance. She is ill-appearing.  HENT:     Head: Normocephalic and atraumatic.     Right Ear: Tympanic membrane, ear canal and external ear normal. There is no impacted cerumen.     Left Ear: Tympanic membrane, ear canal and external ear normal. There is no impacted cerumen.     Nose: Congestion and rhinorrhea present.     Comments: Nasal mucosa is edematous erythematous with thick yellow discharge in both nares.    Mouth/Throat:     Mouth: Mucous membranes are moist.     Pharynx: Oropharynx is clear. Posterior oropharyngeal erythema present. No oropharyngeal exudate.     Comments: Erythema to the posterior oropharynx with yellow postnasal drip. Cardiovascular:     Rate and Rhythm: Normal rate and regular rhythm.     Pulses: Normal pulses.     Heart sounds: Normal heart sounds. No murmur heard.    No friction rub. No gallop.  Pulmonary:     Effort: Pulmonary effort is normal.     Breath sounds: Normal breath sounds. No wheezing, rhonchi or rales.  Musculoskeletal:     Cervical back: Normal range of motion and neck supple. No tenderness.  Lymphadenopathy:     Cervical: No cervical adenopathy.  Skin:    General: Skin is warm and dry.     Capillary Refill: Capillary  refill takes less than 2 seconds.      Findings: No rash.  Neurological:     General: No focal deficit present.     Mental Status: She is alert and oriented to person, place, and time.      UC Treatments / Results  Labs (all labs ordered are listed, but only abnormal results are displayed) Labs Reviewed - No data to display  EKG   Radiology No results found.  Procedures Procedures (including critical care time)  Medications Ordered in UC Medications - No data to display  Initial Impression / Assessment and Plan / UC Course  I have reviewed the triage vital signs and the nursing notes.  Pertinent labs & imaging results that were available during my care of the patient were reviewed by me and considered in my medical decision making (see chart for details).   Patient is a pleasant, though mildly ill-appearing, 23 year old female presenting for evaluation of ongoing and worsening respiratory symptoms as outlined HPI above.  Her physical exam does reveal inflammation of her upper respiratory tract with inflamed nasal mucosa and thick yellow nasal discharge.  She also has yellow postnasal drip and mild erythema to her posterior oropharynx.  No cervical lymphadenopathy appreciated.  Cardiopulmonary exam reveals clear lung sounds in all fields.  She reports that she does feel short of breath though she is able to speak in full sentence without dyspnea or tachypnea.  Respiratory rate at triage was 16 with a 99% room air oxygen saturation.  She is afebrile with an oral temp of 98.4.  Given that she has been experiencing symptoms for a week, and they appear to be worsening, a trial of antibiotics is warranted.  She has a hive allergy to amoxicillin so I will treat her with doxycycline 100 mg twice daily for 7 days.  She should continue her Atrovent nasal spray and use her albuterol inhaler as needed for any shortness breath or wheezing.  I will refill her Promethazine DM cough syrup as well as her Tessalon Perles.  Return  precautions reviewed.  Work note provided.   Final Clinical Impressions(s) / UC Diagnoses   Final diagnoses:  URI with cough and congestion     Discharge Instructions      Take the doxycycline 100 mg twice daily with food for 7 days for treatment of your upper respiratory infection.  Continue to use your albuterol inhaler as needed for any shortness breath or wheezing.  1 to 2 puffs every 4-6 hours.  Use over-the-counter Tylenol and/or ibuprofen according to pack instructions as needed for any fever or pain.  Use the Atrovent nasal spray, 2 squirts in each nostril every 6 hours, as needed for runny nose and postnasal drip.  Use the Tessalon Perles every 8 hours during the day.  Take them with a small sip of water.  They may give you some numbness to the base of your tongue or a metallic taste in your mouth, this is normal.  Use the Promethazine DM cough syrup at bedtime for cough and congestion.  It will make you drowsy so do not take it during the day.  Return for reevaluation or see your primary care provider for any new or worsening symptoms.      ED Prescriptions     Medication Sig Dispense Auth. Provider   benzonatate (TESSALON) 100 MG capsule Take 2 capsules (200 mg total) by mouth every 8 (eight) hours. 21 capsule Becky Augusta, NP   promethazine-dextromethorphan (PROMETHAZINE-DM) 6.25-15  MG/5ML syrup Take 5 mLs by mouth 4 (four) times daily as needed. 118 mL Becky Augusta, NP   doxycycline (VIBRAMYCIN) 100 MG capsule Take 1 capsule (100 mg total) by mouth 2 (two) times daily for 7 days. 14 capsule Becky Augusta, NP      PDMP not reviewed this encounter.   Becky Augusta, NP 05/19/23 [phone]

## 2023-05-28 ENCOUNTER — Ambulatory Visit
Admission: EM | Admit: 2023-05-28 | Discharge: 2023-05-28 | Disposition: A | Discharge status: Home / Self Care | Payer: Self-pay | Attending: Emergency Medicine | Admitting: Emergency Medicine

## 2023-05-28 DIAGNOSIS — B360 Pityriasis versicolor: Secondary | ICD-10-CM

## 2023-05-28 MED ORDER — FLUCONAZOLE 200 MG PO TABS: 200.0000 mg | ORAL_TABLET | ORAL | 0 refills | Status: DC

## 2023-05-28 MED ORDER — KETOCONAZOLE 2 % EX SHAM: 1.0000 | MEDICATED_SHAMPOO | CUTANEOUS | 2 refills | Status: DC

## 2023-05-28 NOTE — ED Triage Notes (Signed)
 Pt c/o full body rash x2days  Pt states that the rash worsened last night but went away this morning.  Pt states that the rash was mostly along the trunk  Pt denies any new detergent, soaps, medications, or lotions.

## 2023-05-28 NOTE — ED Provider Notes (Signed)
 [facility]    CSN: [id number] Arrival date & time: 05/28/23  4098      History   Chief Complaint Chief Complaint  Patient presents with   Rash    HPI Dana Phillips is a 23 y.o. female.   HPI  23 year old female with past medical history significant for seizures, GERD, and anxiety presents for evaluation of a itchy rash that first appeared on her abdomen and back.  She states that last night it spread to her arms but that has since resolved.  She denies any new detergents, soaps, medications, or lotions.  Past Medical History:  Diagnosis Date   Anxiety    GERD (gastroesophageal reflux disease)    Kidney infection    Seizures (HCC)     Patient Active Problem List   Diagnosis Date Noted   Anxiety and depression 03/11/2021   Chronic bilateral low back pain without sciatica 01/26/2020    Past Surgical History:  Procedure Laterality Date   NO PAST SURGERIES     WISDOM TOOTH EXTRACTION      OB History     Gravida  0   Para  0   Term  0   Preterm  0   AB  0   Living  0      SAB  0   IAB  0   Ectopic  0   Multiple  0   Live Births  0            Home Medications    Prior to Admission medications   Medication Sig Start Date End Date Taking? Authorizing Provider  albuterol (VENTOLIN HFA) 108 (90 Base) MCG/ACT inhaler Inhale 1-2 puffs into the lungs every 4 (four) hours as needed for wheezing or shortness of breath. 03/22/23  Yes Domenick Gong, MD  busPIRone (BUSPAR) 10 MG tablet Take 1 tablet (10 mg total) by mouth 2 (two) times daily as needed. 02/16/23  Yes Karamalegos, Netta Neat, DO  etonogestrel (NEXPLANON) 68 MG IMPL implant 1 each (68 mg total) by Subdermal route once for 1 dose. 01/09/21  Yes Copland, Ilona Sorrel, PA-C  fluconazole (DIFLUCAN) 200 MG tablet Take 1 tablet (200 mg total) by mouth once a week for 4 doses. 05/28/23 06/19/23 Yes Becky Augusta, NP  fluticasone [facility]) 50 MCG/ACT nasal spray Place 2 sprays into  both nostrils daily. 03/22/23  Yes Domenick Gong, MD  fluvoxaMINE (LUVOX) 25 MG tablet TAKE 1 TABLET(25 MG) BY MOUTH AT BEDTIME 05/14/23  Yes Baity, Salvadore Oxford, NP  ibuprofen (ADVIL) 800 MG tablet Take 1 tablet (800 mg total) by mouth 3 (three) times daily. 07/14/22  Yes White, Adrienne R, NP  ipratropium (ATROVENT) 0.06 % nasal spray Place 2 sprays into both nostrils 4 (four) times daily. 05/14/23  Yes Becky Augusta, NP  ketoconazole (NIZORAL) 2 % shampoo Apply 1 Application topically 2 (two) times a week. Use the ketoconazole shampoo as a body wash daily for the first week and then biweekly thereafter. 05/31/23  Yes Becky Augusta, NP  loperamide (IMODIUM) 2 MG capsule Take 1 capsule (2 mg total) by mouth 4 (four) times daily as needed for diarrhea or loose stools. 04/06/22  Yes White, Elita Boone, NP  NON FORMULARY Take 75 mg by mouth daily. CBD Gummy   Yes [provider]    Family History Family History  Problem Relation Age of Onset   Hypertension Mother    Sudden Cardiac Death Neg Hx     Social History  Social History   Tobacco Use   Smoking status: Never   Smokeless tobacco: Never  Vaping Use   Vaping status: Every Day   Substances: Nicotine, Flavoring  Substance Use Topics   Alcohol use: No   Drug use: Yes    Types: Marijuana    Comment: CBD gummies     Allergies   Amoxicillin   Review of Systems Review of Systems  Skin:  Positive for rash.     Physical Exam Triage Vital Signs ED Triage Vitals  Encounter Vitals Group     BP      Systolic BP Percentile      Diastolic BP Percentile      Pulse      Resp      Temp      Temp src      SpO2      Weight      Height      Head Circumference      Peak Flow      Pain Score      Pain Loc      Pain Education      Exclude from Growth Chart    No data found.  Updated Vital Signs BP (P) 112/74 (BP Location: Right Arm)   Pulse (P) 87   Temp (P) 99.2 F (37.3 C) (Oral)   Ht 5\' 9"  (1.753 m)   Wt 175 lb  (79.4 kg)   LMP 05/07/2023   BMI 25.84 kg/m   Visual Acuity Right Eye Distance:   Left Eye Distance:   Bilateral Distance:    Right Eye Near:   Left Eye Near:    Bilateral Near:     Physical Exam Vitals and nursing note reviewed.  Constitutional:      Appearance: Normal appearance. She is not ill-appearing.  HENT:     Head: Normocephalic and atraumatic.  Skin:    General: Skin is warm and dry.     Capillary Refill: Capillary refill takes less than 2 seconds.     Findings: Rash present.  Neurological:     General: No focal deficit present.     Mental Status: She is alert and oriented to person, place, and time.      UC Treatments / Results  Labs (all labs ordered are listed, but only abnormal results are displayed) Labs Reviewed - No data to display  EKG   Radiology No results found.  Procedures Procedures (including critical care time)  Medications Ordered in UC Medications - No data to display  Initial Impression / Assessment and Plan / UC Course  I have reviewed the triage vital signs and the nursing notes.  Pertinent labs & imaging results that were available during my care of the patient were reviewed by me and considered in my medical decision making (see chart for details).   Patient is a nontoxic-appearing 23 year old female presenting for evaluation of a rash on her abdomen and back that started 2 days ago.  She describes the rash as itchy.    As you can see the images above, the rash is scattered and consist of erythematous macular lesions with a scaly texture.  She was recently treated for a viral respiratory infection and then with a course of doxycycline after her symptoms have not resolved in a week.  She finished the doxycycline prior to onset of the rash.  The rash does not appear to be a drug reaction nor is it urticarial in appearance.  Given the  erythematous macular, scaly lesions I suspect it is most likely a tinea versicolor.  I will treat  her with Diflucan 200 mg once weekly x 4 weeks.  I will also prescribe ketoconazole shampoo and she can use it as a body wash daily for the first week followed by twice weekly going forward.  Return precautions reviewed.  Work note provided.   Final Clinical Impressions(s) / UC Diagnoses   Final diagnoses:  Tinea versicolor     Discharge Instructions      Take the Diflucan 200 mg tablets once weekly for 4 weeks to help with your tinea versicolor infection.  Use the ketoconazole shampoo as a body wash once daily for the first week followed by twice weekly thereafter.  If your symptoms do not improve, or new symptoms develop, either return for reevaluation or see your primary care provider.     ED Prescriptions     Medication Sig Dispense Auth. Provider   fluconazole (DIFLUCAN) 200 MG tablet Take 1 tablet (200 mg total) by mouth once a week for 4 doses. 4 tablet Becky Augusta, NP   ketoconazole (NIZORAL) 2 % shampoo Apply 1 Application topically 2 (two) times a week. Use the ketoconazole shampoo as a body wash daily for the first week and then biweekly thereafter. 120 mL Becky Augusta, NP      PDMP not reviewed this encounter.   Becky Augusta, NP 05/28/23 [phone]

## 2023-05-28 NOTE — Discharge Instructions (Signed)
 Take the Diflucan 200 mg tablets once weekly for 4 weeks to help with your tinea versicolor infection.  Use the ketoconazole shampoo as a body wash once daily for the first week followed by twice weekly thereafter.  If your symptoms do not improve, or new symptoms develop, either return for reevaluation or see your primary care provider.

## 2023-06-14 ENCOUNTER — Ambulatory Visit (INDEPENDENT_AMBULATORY_CARE_PROVIDER_SITE_OTHER): Payer: Self-pay | Admitting: Internal Medicine

## 2023-06-14 ENCOUNTER — Encounter: Payer: Self-pay | Admitting: Internal Medicine

## 2023-06-14 VITALS — BP 120/72 | Ht 69.0 in | Wt 184.4 lb

## 2023-06-14 DIAGNOSIS — F99 Mental disorder, not otherwise specified: Secondary | ICD-10-CM

## 2023-06-14 DIAGNOSIS — G4700 Insomnia, unspecified: Secondary | ICD-10-CM | POA: Insufficient documentation

## 2023-06-14 DIAGNOSIS — B360 Pityriasis versicolor: Secondary | ICD-10-CM

## 2023-06-14 DIAGNOSIS — F419 Anxiety disorder, unspecified: Secondary | ICD-10-CM

## 2023-06-14 DIAGNOSIS — F5105 Insomnia due to other mental disorder: Secondary | ICD-10-CM

## 2023-06-14 DIAGNOSIS — M545 Low back pain, unspecified: Secondary | ICD-10-CM

## 2023-06-14 DIAGNOSIS — G8929 Other chronic pain: Secondary | ICD-10-CM

## 2023-06-14 DIAGNOSIS — F32A Depression, unspecified: Secondary | ICD-10-CM

## 2023-06-14 MED ORDER — HYDROXYZINE PAMOATE 25 MG PO CAPS: 25.0000 mg | ORAL_CAPSULE | Freq: Every evening | ORAL | 1 refills | Status: AC | PRN

## 2023-06-14 NOTE — Progress Notes (Signed)
 Subjective:    Patient ID: Dana Phillips, female    DOB: 01/01/2001, 23 y.o.   MRN: [id number]  HPI  Patient presents to clinic today for follow-up of chronic conditions.  Anxiety and depression: Chronic, managed on fluvoxamine and buspirone. She has been feeling more anxious, having trouble sleeping and reports decreased libido. She is not currently seeing a therapist.  She denies SI/HI.  Chronic low back pain: X-ray lumbar spine from 12/2020 reviewed.  She takes tylenol as needed with good relief of symptoms.  She does not follow with orthopedics.  Insomnia: She has trouble falling asleep and staying asleep. She is not currently taking any medication for this. She has tried melatonin in the past. There is no sleep study on file.   Tinea versicolor: recent diagnosis at [facility]. She is currently using nizoral shampoo and fluconazole. She does not follow with dermatology.  Review of Systems     Past Medical History:  Diagnosis Date   Anxiety    GERD (gastroesophageal reflux disease)    Kidney infection    Seizures (HCC)     Current Outpatient Medications  Medication Sig Dispense Refill   albuterol (VENTOLIN HFA) 108 (90 Base) MCG/ACT inhaler Inhale 1-2 puffs into the lungs every 4 (four) hours as needed for wheezing or shortness of breath. 1 each 0   busPIRone (BUSPAR) 10 MG tablet Take 1 tablet (10 mg total) by mouth 2 (two) times daily as needed. 180 tablet 0   etonogestrel (NEXPLANON) 68 MG IMPL implant 1 each (68 mg total) by Subdermal route once for 1 dose. 1 each 0   fluconazole (DIFLUCAN) 200 MG tablet Take 1 tablet (200 mg total) by mouth once a week for 4 doses. 4 tablet 0   fluticasone (FLONASE) 50 MCG/ACT nasal spray Place 2 sprays into both nostrils daily. 16 g 0   fluvoxaMINE (LUVOX) 25 MG tablet TAKE 1 TABLET(25 MG) BY MOUTH AT BEDTIME 90 tablet 0   ibuprofen (ADVIL) 800 MG tablet Take 1 tablet (800 mg total) by mouth 3 (three) times daily. 21 tablet 0   ipratropium  (ATROVENT) 0.06 % nasal spray Place 2 sprays into both nostrils 4 (four) times daily. 15 mL 12   ketoconazole (NIZORAL) 2 % shampoo Apply 1 Application topically 2 (two) times a week. Use the ketoconazole shampoo as a body wash daily for the first week and then biweekly thereafter. 120 mL 2   loperamide (IMODIUM) 2 MG capsule Take 1 capsule (2 mg total) by mouth 4 (four) times daily as needed for diarrhea or loose stools. 12 capsule 0   NON FORMULARY Take 75 mg by mouth daily. CBD Gummy     No current facility-administered medications for this visit.    Allergies  Allergen Reactions   Amoxicillin Rash    Family History  Problem Relation Age of Onset   Hypertension Mother    Sudden Cardiac Death Neg Hx     Social History   Socioeconomic History   Marital status: Single    Spouse name: Not on file   Number of children: Not on file   Years of education: Not on file   Highest education level: Not on file  Occupational History   Not on file  Tobacco Use   Smoking status: Never   Smokeless tobacco: Never  Vaping Use   Vaping status: Every Day   Substances: Nicotine, Flavoring  Substance and Sexual Activity   Alcohol use: No   Drug use: Yes  Types: Marijuana    Comment: CBD gummies   Sexual activity: Yes    Birth control/protection: Implant  Other Topics Concern   Not on file  Social History Narrative   Not on file   Social Drivers of Health   Financial Resource Strain: Low Risk  (06/06/2018)   Received from [facility], [facility]   Overall Financial Resource Strain (CARDIA)    Difficulty of Paying Living Expenses: Not hard at all  Food Insecurity: No Food Insecurity (06/06/2018)   Received from [facility], [facility]   Hunger Vital Sign    Worried About Running Out of Food in the Last Year: Never true    Ran Out of Food in the Last Year: Never true  Transportation Needs: No Transportation Needs (06/06/2018)   Received from [facility], [facility]   [facility] - Transportation    Lack of Transportation (Medical): No    Lack of Transportation (Non-Medical): No  Physical Activity: Sufficiently Active (06/06/2018)   Received from [facility], [facility]   Exercise Vital Sign    Days of Exercise per Week: 7 days    Minutes of Exercise per Session: 40 min  Stress: Stress Concern Present (06/06/2018)   Received from [facility],  [facility] - Occupational Stress Questionnaire    Feeling of Stress : Rather much  Social Connections: Socially Isolated (06/06/2018)   Received from [facility], [facility]   Social Connection and Isolation Panel [NHANES]    Frequency of Communication with Friends and Family: Once a week    Frequency of Social Gatherings with Friends and Family: Never    Attends Religious Services: Never    Database administrator or Organizations: No    Attends Banker Meetings: Never    Marital Status: Never married  Intimate Partner Violence: Not At Risk (06/06/2018)   Received from [facility], [facility]   Humiliation, Afraid, Rape, and Kick questionnaire    Fear of Current or Ex-Partner: No    Emotionally Abused: No    Physically Abused: No    Sexually Abused: No     Constitutional: Denies fever, malaise, fatigue, headache or abrupt weight changes.  HEENT: Denies eye pain, eye redness, ear pain, ringing in the ears, wax buildup, runny nose, nasal congestion, bloody nose, or sore throat. Respiratory: Denies difficulty breathing, shortness of breath, cough or sputum production.   Cardiovascular: Denies chest pain, chest tightness, palpitations or swelling in the hands or feet.  Gastrointestinal: Denies abdominal pain, bloating, constipation, diarrhea or blood in the stool.  GU: Denies urgency, frequency, pain with urination, burning sensation, blood in urine, odor or discharge. Musculoskeletal: Patient reports chronic  low back pain.  Denies decrease in range of motion, difficulty with gait, or joint swelling.  Skin: Patient reports rash of stomach.  Denies redness, lesions or ulcercations.  Neurological: Patient reports insomnia.  Denies dizziness, difficulty with memory, difficulty with speech or problems with balance and coordination.  Psych: Patient has a history of anxiety and depression.  Denies SI/HI.  No other specific complaints in a complete review of systems (except as listed in HPI above).  Objective:   Physical Exam BP 120/72 (BP Location: Right Arm, Patient Position: Sitting, Cuff Size: Normal)   Ht 5\' 9"  (1.753 m)   Wt 184 lb 6.4 oz (83.6 kg)   LMP 05/07/2023  BMI 27.23 kg/m    Wt Readings from Last 3 Encounters:  05/28/23 175 lb (79.4 kg)  05/14/23 171 lb 15.3 oz (78 kg)  02/04/23 172 lb (78 kg)    General: Appears her stated age, well developed, well nourished in NAD. Skin: Warm, dry and intact.  Convalescent, hypopigmented rash noted to the abdomen. HEENT: Head: normal shape and size; Eyes: sclera white, no icterus, conjunctiva pink, PERRLA and EOMs intact;  Cardiovascular: Normal rate and rhythm. S1,S2 noted.  No murmur, rubs or gallops noted.  Pulmonary/Chest: Normal effort and positive vesicular breath sounds. No respiratory distress. No wheezes, rales or ronchi noted.  Musculoskeletal: Strength 5/5 BUE/BLE.  No difficulty with gait.  Neurological: Alert and oriented. Coordination normal.  Psychiatric: Mood and affect normal. Behavior is normal. Judgment and thought content normal.   BMET    Component Value Date/Time   NA 139 12/14/2022 0911   K 4.1 12/14/2022 0911   CL 103 12/14/2022 0911   CO2 25 12/14/2022 0911   GLUCOSE 87 12/14/2022 0911   BUN 15 12/14/2022 0911   CREATININE 0.71 12/14/2022 0911   CALCIUM 10.1 12/14/2022 0911   GFRNONAA >60 02/12/2020 1026   GFRNONAA SEE NOTE 07/22/2016 1157   GFRAA NOT CALCULATED 08/01/2019 2057   GFRAA SEE NOTE 07/22/2016  1157    Lipid Panel     Component Value Date/Time   CHOL 193 12/14/2022 0911   TRIG 67 12/14/2022 0911   HDL 78 12/14/2022 0911   CHOLHDL 2.5 12/14/2022 0911   LDLCALC 100 (H) 12/14/2022 0911    CBC    Component Value Date/Time   WBC 5.2 12/14/2022 0911   RBC 4.48 12/14/2022 0911   HGB 14.2 12/14/2022 0911   HCT 43.0 12/14/2022 0911   PLT 240 12/14/2022 0911   MCV 96.0 12/14/2022 0911   MCH 31.7 12/14/2022 0911   MCHC 33.0 12/14/2022 0911   RDW 11.8 12/14/2022 0911   LYMPHSABS 2,344 10/05/2019 1559   MONOABS 0.8 08/01/2019 2057   EOSABS 180 10/05/2019 1559   BASOSABS 19 10/05/2019 1559    Hgb A1C Lab Results  Component Value Date   HGBA1C 5.3 12/14/2022            Assessment & Plan:   Tinea versicolor:  Continue nizoral and fluconazole as previously prescribed by urgent care  RTC in 6 months, for your annual exam Nicki Reaper, NP

## 2023-06-14 NOTE — Patient Instructions (Signed)
 Managing Anxiety, Adult  After being diagnosed with anxiety, you may be relieved to know why you have felt or behaved a certain way. You may also feel overwhelmed about the treatment ahead and what it will mean for your life. With care and support, you can manage your anxiety.  How to manage lifestyle changes  Understanding the difference between stress and anxiety  Although stress can play a role in anxiety, it is not the same as anxiety. Stress is your body's reaction to life changes and events, both good and bad. Stress is often caused by something external, such as a deadline, test, or competition. It normally goes away after the event has ended and will last just a few hours. But, stress can be ongoing and can lead to more than just stress.  Anxiety is caused by something internal, such as imagining a terrible outcome or worrying that something will go wrong that will greatly upset you. Anxiety often does not go away even after the event is over, and it can become a long-term (chronic) worry.  Lowering stress and anxiety    Talk with your health care provider or a counselor to learn more about lowering anxiety and stress. They may suggest tension-reduction techniques, such as:  Music. Spend time creating or listening to music that you enjoy and that inspires you.  Mindfulness-based meditation. Practice being aware of your normal breaths while not trying to control your breathing. It can be done while sitting or walking.  Centering prayer. Focus on a word, phrase, or sacred image that means something to you and brings you peace.  Deep breathing. Expand your stomach and inhale slowly through your nose. Hold your breath for 3-5 seconds. Then breathe out slowly, letting your stomach muscles relax.  Self-talk. Learn to notice and spot thought patterns that lead to anxiety reactions. Change those patterns to thoughts that feel peaceful.  Muscle relaxation. Take time to tense muscles and then relax them.  Choose a  tension-reduction technique that fits your lifestyle and personality. These techniques take time and practice. Set aside 5-15 minutes a day to do them. Specialized therapists can offer counseling and training in these techniques. The training to help with anxiety may be covered by some insurance plans.  Other things you can do to manage stress and anxiety include:  Keeping a stress diary. This can help you learn what triggers your reaction and then learn ways to manage your response.  Thinking about how you react to certain situations. You may not be able to control everything, but you can control your response.  Making time for activities that help you relax and not feeling guilty about spending your time in this way.  Doing visual imagery. This involves imagining or creating mental pictures to help you relax.  Practicing yoga. Through yoga poses, you can lower tension and relax.     Medicines  Medicines for anxiety include:  Antidepressant medicines. These are usually prescribed for long-term daily control.  Anti-anxiety medicines. These may be added in severe cases, especially when panic attacks occur.  When used together, medicines, psychotherapy, and tension-reduction techniques may be the most effective treatment.  Relationships  Relationships can play a big part in helping you recover. Spend more time connecting with trusted friends and family members. Think about going to couples counseling if you have a partner, taking family education classes, or going to family therapy. Therapy can help you and others better understand your anxiety.  How to recognize changes in  your anxiety  Everyone responds differently to treatment for anxiety. Recovery from anxiety happens when symptoms lessen and stop interfering with your daily life at home or work. This may mean that you will start to:  Have better concentration and focus. Worry will interfere less in your daily thinking.  Sleep better.  Be less irritable.  Have  more energy.  Have improved memory.  Try to recognize when your condition is getting worse. Contact your provider if your symptoms interfere with home or work and you feel like your condition is not improving.  Follow these instructions at home:  Activity  Exercise. Adults should:  Exercise for at least 150 minutes each week. The exercise should increase your heart rate and make you sweat (moderate-intensity exercise).  Do strengthening exercises at least twice a week.  Get the right amount and quality of sleep. Most adults need 7-9 hours of sleep each night.  Lifestyle    Eat a healthy diet that includes plenty of vegetables, fruits, whole grains, low-fat dairy products, and lean protein.  Do not eat a lot of foods that are high in fats, added sugars, or salt (sodium).  Make choices that simplify your life.  Do not use any products that contain nicotine or tobacco. These products include cigarettes, chewing tobacco, and vaping devices, such as e-cigarettes. If you need help quitting, ask your provider.  Avoid caffeine, alcohol, and certain over-the-counter cold medicines. These may make you feel worse. Ask your pharmacist which medicines to avoid.  General instructions  Take over-the-counter and prescription medicines only as told by your provider.  Keep all follow-up visits. This is to make sure you are managing your anxiety well or if you need more support.  Where to find support  You can get help and support from:  Self-help groups.  Online and [employer].  A trusted spiritual leader.  Couples counseling.  Family education classes.  Family therapy.  Where to find more information  You may find that joining a support group helps you deal with your anxiety. The following sources can help you find counselors or support groups near you:  Mental Health America: mentalhealthamerica.net  Anxiety and Depression Association of [place] (ADAA): adaa.org  [employer] on Mental Illness (NAMI):  nami.org  Contact a health care provider if:  You have a hard time staying focused or finishing tasks.  You spend many hours a day feeling worried about everyday life.  You are very tired because you cannot stop worrying.  You start to have headaches or often feel tense.  You have chronic nausea or diarrhea.  Get help right away if:  Your heart feels like it is racing.  You have shortness of breath.  You have thoughts of hurting yourself or others.  Get help right away if you feel like you may hurt yourself or others, or have thoughts about taking your own life. Go to your nearest emergency room or:  Call 911.  Call the National Suicide Prevention Lifeline at 2043122231 or 988. This is open 24 hours a day.  Text the Crisis Text Line at (832)170-7952.  This information is not intended to replace advice given to you by your health care provider. Make sure you discuss any questions you have with your health care provider.  Document Revised: 12/16/2021 Document Reviewed: 06/30/2020  Elsevier Patient Education  2024 [employer].

## 2023-06-14 NOTE — Assessment & Plan Note (Signed)
 Will discontinue fluvoxamine as she is feeling more anxious complaining of decreased libido Continue buspirone 10 mg [employer] offered

## 2023-06-14 NOTE — Assessment & Plan Note (Signed)
 Will trial hydroxyzine 25-50 mg at bedtime prn

## 2023-06-14 NOTE — Assessment & Plan Note (Signed)
Encourage regular stretching Okay to continue CBD Gummies Referral to physical therapy for further evaluation and treatment

## 2023-08-21 IMAGING — MR MR BRAIN/TEMPORAL BONE/IAC
12 of 13 series · 45 of 48 positions shown · IV contrast (multihance)
Comparison: CT head 01/08/2018

CLINICAL DATA: Dizziness, nausea

EXAM:
MRI HEAD WITHOUT AND WITH CONTRAST
TECHNIQUE: Multiplanar, multiecho pulse sequences of the brain and surrounding
structures were obtained without and with intravenous contrast.
CONTRAST:  13mL MULTIHANCE GADOBENATE DIMEGLUMINE 529 MG/ML IV SOLN

[Series 5: T1 · sagittal · 4.0mm · 0.72mm/px · 1 of 29 slices shown (1 of 3)]
[im 1/29]
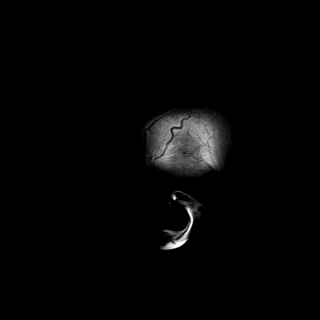

[Series 6: DWI · axial · 3.0mm · 0.94mm/px · z∈[-93,+58]mm · 12 of 171 slices shown]
[im 1/171]
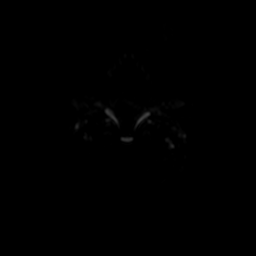
[im 16/171]
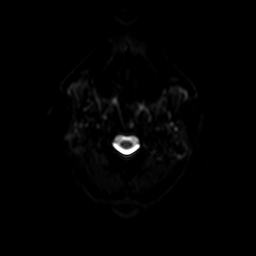
[im 31/171]
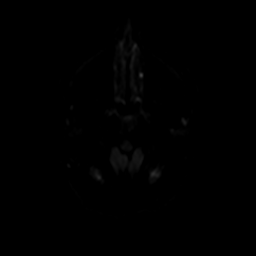
[im 47/171]
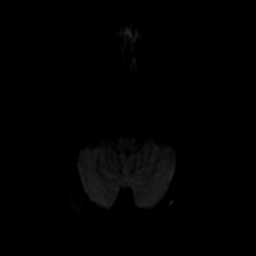
[im 62/171]
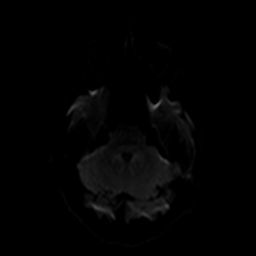
[im 78/171]
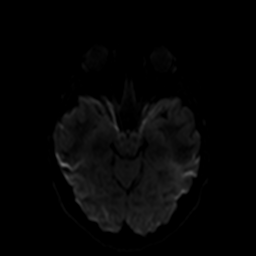
[im 93/171]
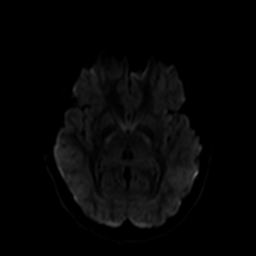
[im 109/171]
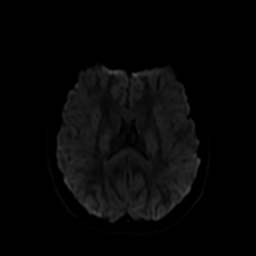
[im 124/171]
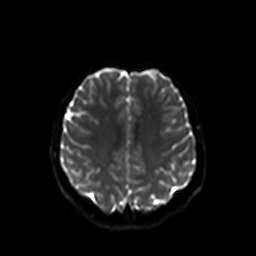
[im 140/171]
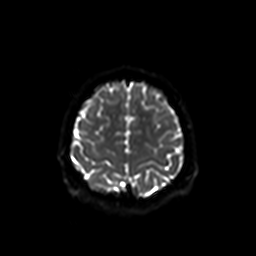
[im 155/171]
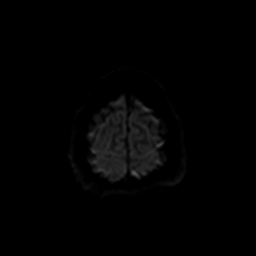
[im 171/171]
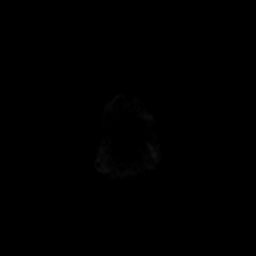

[Series 7: ax dwi_tracew · axial · 3.0mm · 0.94mm/px · z∈[-93,+58]mm · 6 of 85 slices shown]
[im 1/85]
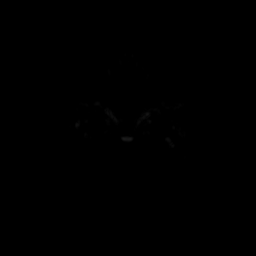
[im 17/85]
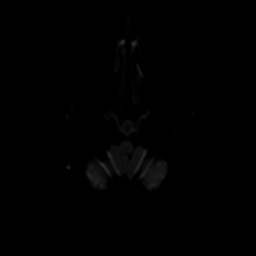
[im 34/85]
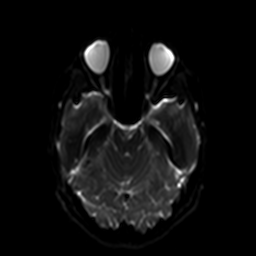
[im 51/85]
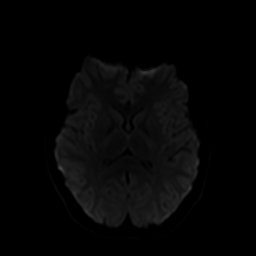
[im 68/85]
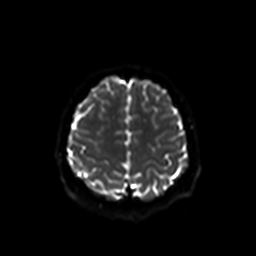
[im 85/85]
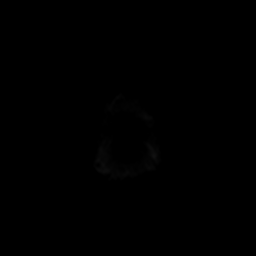

[Series 8: ax dwi_adc · axial · 3.0mm · 0.94mm/px · z∈[-93,+58]mm · 3 of 43 slices shown]
[im 1/43]
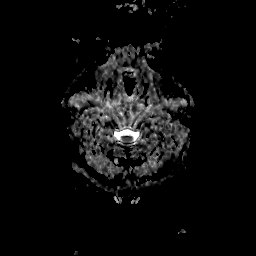
[im 22/43]
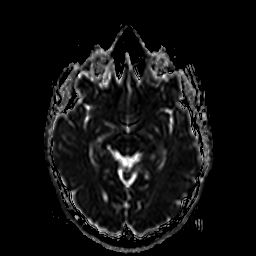
[im 43/43]
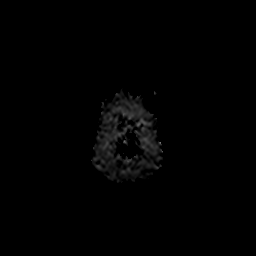

[Series 9: T2 · axial · 4.0mm · 0.36mm/px · z∈[-87,+64]mm · 2 of 30 slices shown]
[im 1/30]
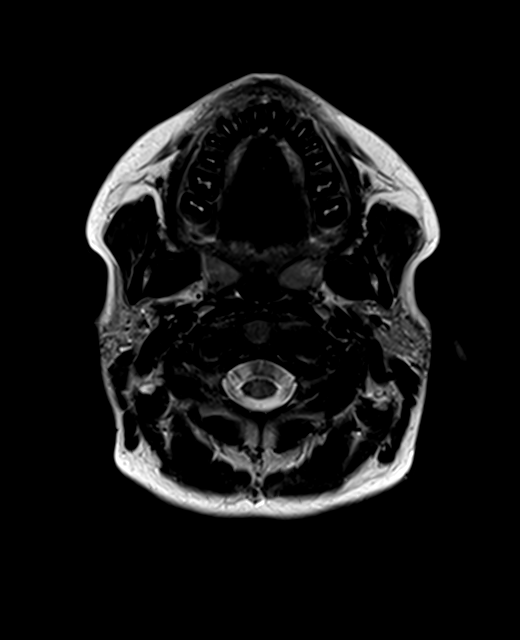
[im 30/30]
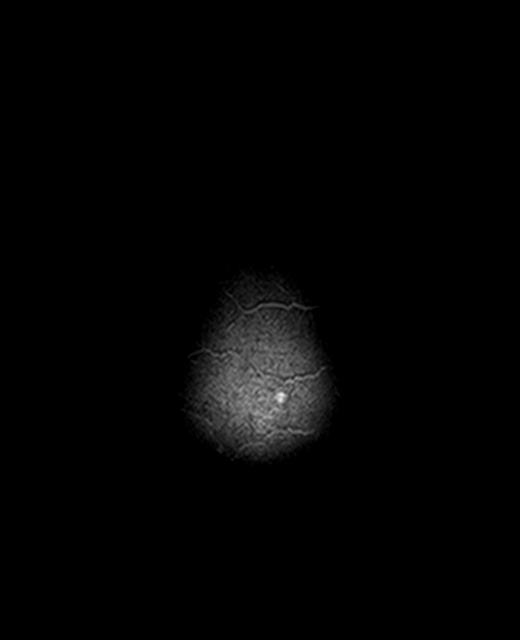

[Series 10: FLAIR · axial · 3.0mm · 0.72mm/px · z∈[-92,+70]mm · 2 of 28 slices shown]
[im 1/28]
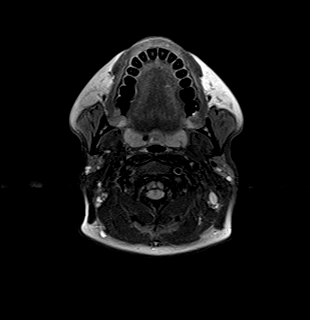
[im 28/28]
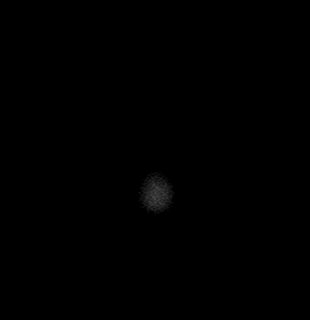

[Series 12: swi_images · axial · 3.0mm · 0.90mm/px · z∈[-88,+65]mm · 4 of 52 slices shown]
[im 1/52]
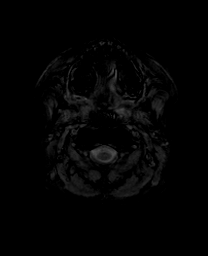
[im 18/52]
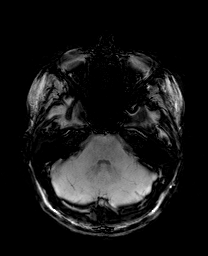
[im 35/52]
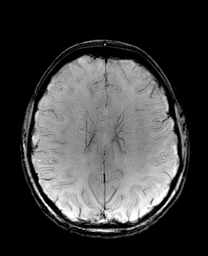
[im 52/52]
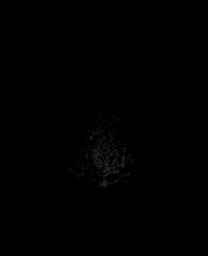

[Series 13: T1 · coronal · 3.0mm · 0.56mm/px · 1 of 13 slices shown (2 of 3)]
[im 1/13]
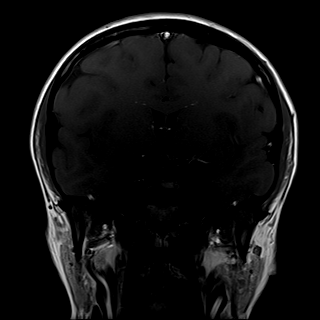

[Series 15: T1 · axial · 3.0mm · 0.50mm/px · 1 of 13 slices shown (3 of 3)]
[im 1/13]
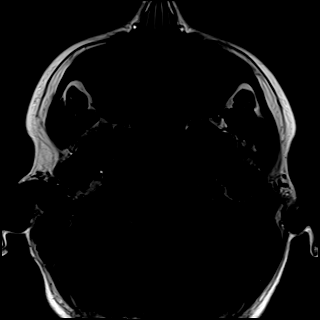

[Series 16: T1 post-contrast · coronal · 3.0mm · 0.56mm/px · 1 of 13 slices shown (1 of 3)]
[im 1/13]
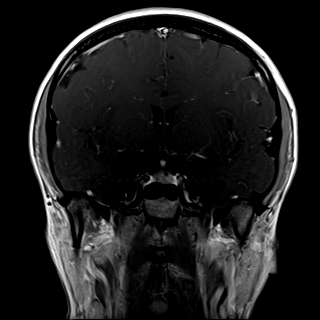

[Series 17: T1 post-contrast · axial · 3.0mm · 0.50mm/px · 1 of 13 slices shown (2 of 3)]
[im 1/13]
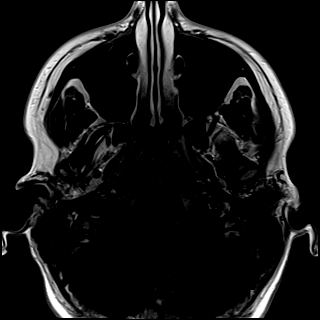

[Series 18: T1 post-contrast · axial · 1.0mm · 0.90mm/px · z∈[-91,+68]mm · 11 of 160 slices shown (3 of 3)]
[im 1/160]
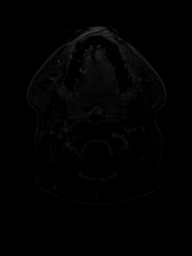
[im 16/160]
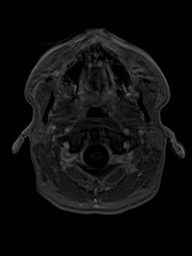
[im 32/160]
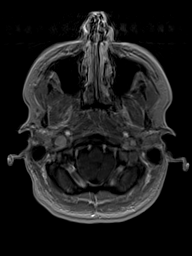
[im 48/160]
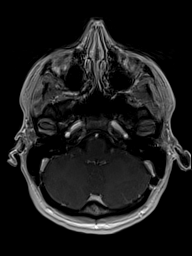
[im 64/160]
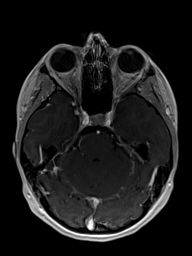
[im 80/160]
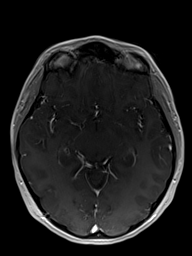
[im 96/160]
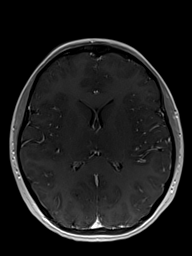
[im 112/160]
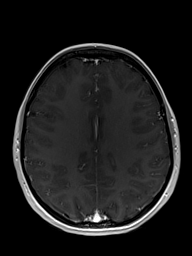
[im 128/160]
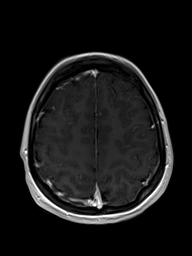
[im 144/160]
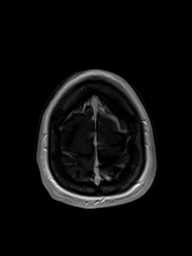
[im 160/160]
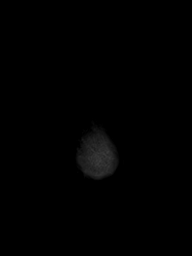

[45 of 48 positions shown; findings below may reference images not displayed]

FINDINGS: Brain: There is no acute intracranial hemorrhage, extra-axial fluid
collection, or acute infarct.

Parenchymal volume is normal. The ventricles are normal in size.
Gray-white differentiation is preserved. Parenchymal signal is
normal.

There is no mass lesion.  There is no mass effect or midline shift.

Internal auditory canals: There is no cerebellopontine angle mass.
The cochleae and semicircular canals are normal. No focal
abnormality along the course of the 7th and 8th cranial nerves.
Normal porus acusticus and vestibular aqueduct bilaterally.

Vascular: Normal flow voids.

Skull and upper cervical spine: Normal marrow signal.

Sinuses/Orbits: The paranasal sinuses are clear. The globes and
orbits are unremarkable.

Other: The mastoid air cells are clear.
IMPRESSION: Normal appearance of the brain and IAC's.

## 2023-08-26 ENCOUNTER — Ambulatory Visit
Admission: EM | Admit: 2023-08-26 | Discharge: 2023-08-26 | Disposition: A | Discharge status: Home / Self Care | Payer: Self-pay | Attending: Emergency Medicine | Admitting: Emergency Medicine

## 2023-08-26 ENCOUNTER — Encounter: Payer: Self-pay | Admitting: Emergency Medicine

## 2023-08-26 DIAGNOSIS — J020 Streptococcal pharyngitis: Secondary | ICD-10-CM

## 2023-08-26 LAB — RESP PANEL BY RT-PCR (FLU A&B, COVID) ARPGX2
Influenza A by PCR: NEGATIVE
Influenza B by PCR: NEGATIVE
SARS Coronavirus 2 by RT PCR: NEGATIVE

## 2023-08-26 LAB — GROUP A STREP BY PCR: Group A Strep by PCR: DETECTED — AB

## 2023-08-26 MED ORDER — AZITHROMYCIN 250 MG PO TABS: 250.0000 mg | ORAL_TABLET | Freq: Every day | ORAL | 0 refills | Status: DC

## 2023-08-26 NOTE — ED Provider Notes (Signed)
 [facility]    CSN: [id number] Arrival date & time: 08/26/23  0851      History   Chief Complaint Chief Complaint  Patient presents with   Sore Throat   Generalized Body Aches    HPI Dana Phillips is a 23 y.o. female.   HPI  23 year old female with past medical history significant for GERD, seizures, and anxiety presents for evaluation of sore throat and bodyaches that began yesterday.  She denies any upper or lower respiratory symptoms and she also denies fever.  She does report being exposed to someone who is positive for scarlet fever.  Past Medical History:  Diagnosis Date   Anxiety    GERD (gastroesophageal reflux disease)    Kidney infection    Seizures (HCC)     Patient Active Problem List   Diagnosis Date Noted   Insomnia 06/14/2023   Anxiety and depression 03/11/2021   Chronic bilateral low back pain without sciatica 01/26/2020    Past Surgical History:  Procedure Laterality Date   NO PAST SURGERIES     WISDOM TOOTH EXTRACTION      OB History     Gravida  0   Para  0   Term  0   Preterm  0   AB  0   Living  0      SAB  0   IAB  0   Ectopic  0   Multiple  0   Live Births  0            Home Medications    Prior to Admission medications   Medication Sig Start Date End Date Taking? Authorizing Provider  azithromycin  (ZITHROMAX  Z-PAK) 250 MG tablet Take 1 tablet (250 mg total) by mouth daily. Take 2 tablets on the first day and then 1 tablet daily thereafter for a total of 5 days of treatment. 08/26/23  Yes Kent Pear, NP  albuterol  (VENTOLIN  HFA) 108 (90 Base) MCG/ACT inhaler Inhale 1-2 puffs into the lungs every 4 (four) hours as needed for wheezing or shortness of breath. 03/22/23   Ethlyn Herd, MD  busPIRone  (BUSPAR ) 10 MG tablet Take 1 tablet (10 mg total) by mouth 2 (two) times daily as needed. 02/16/23   Karamalegos, Kayleen Party, DO  etonogestrel  (NEXPLANON ) 68 MG IMPL implant 1 each (68 mg total) by  Subdermal route once for 1 dose. 01/09/21   Copland, Alicia B, PA-C  fluconazole  (DIFLUCAN ) 200 MG tablet Take 200 mg by mouth once a week.    [provider]  fluticasone  (FLONASE ) 50 MCG/ACT nasal spray Place 2 sprays into both nostrils daily. 03/22/23   Ethlyn Herd, MD  hydrOXYzine  (VISTARIL ) 25 MG capsule Take 1-2 capsules (25-50 mg total) by mouth at bedtime as needed. 06/14/23   Carollynn Cirri, NP  ibuprofen  (ADVIL ) 800 MG tablet Take 1 tablet (800 mg total) by mouth 3 (three) times daily. 07/14/22   White, Adrienne R, NP  ipratropium (ATROVENT ) 0.06 % nasal spray Place 2 sprays into both nostrils 4 (four) times daily. 05/14/23   Kent Pear, NP  ketoconazole  (NIZORAL ) 2 % shampoo Apply 1 Application topically 2 (two) times a week. Use the ketoconazole  shampoo as a body wash daily for the first week and then biweekly thereafter. 05/31/23   Kent Pear, NP  NON FORMULARY Take 75 mg by mouth daily. CBD Gummy    [provider]    Family History Family History  Problem Relation Age of Onset  Hypertension Mother    Sudden Cardiac Death Neg Hx     Social History Social History   Tobacco Use   Smoking status: Never   Smokeless tobacco: Never  Vaping Use   Vaping status: Every Day   Substances: Nicotine , Flavoring  Substance Use Topics   Alcohol use: No   Drug use: Yes    Types: Marijuana    Comment: CBD gummies     Allergies   Amoxicillin   Review of Systems Review of Systems  Constitutional:  Negative for fever.  HENT:  Positive for sore throat. Negative for congestion, ear pain and rhinorrhea.   Respiratory:  Negative for cough.   Musculoskeletal:  Positive for arthralgias and myalgias.     Physical Exam Triage Vital Signs ED Triage Vitals  Encounter Vitals Group     BP      Systolic BP Percentile      Diastolic BP Percentile      Pulse      Resp      Temp      Temp src      SpO2      Weight      Height      Head Circumference       Peak Flow      Pain Score      Pain Loc      Pain Education      Exclude from Growth Chart    No data found.  Updated Vital Signs BP 113/73 (BP Location: Right Arm)   Pulse 99   Temp 98.2 F (36.8 C) (Oral)   Resp 16   SpO2 98%   Visual Acuity Right Eye Distance:   Left Eye Distance:   Bilateral Distance:    Right Eye Near:   Left Eye Near:    Bilateral Near:     Physical Exam Vitals and nursing note reviewed.  Constitutional:      Appearance: Normal appearance. She is not ill-appearing.  HENT:     Head: Normocephalic and atraumatic.     Mouth/Throat:     Mouth: Mucous membranes are moist.     Pharynx: Oropharyngeal exudate and posterior oropharyngeal erythema present.     Comments: Saw palate tonsillar pillars are erythematous and tonsillar pillars are 1+ edematous with white exudate. Neck:     Comments: Bilateral anterior, nontender cervical lymphadenopathy present. Cardiovascular:     Rate and Rhythm: Normal rate and regular rhythm.     Pulses: Normal pulses.     Heart sounds: Normal heart sounds. No murmur heard.    No friction rub. No gallop.  Pulmonary:     Effort: Pulmonary effort is normal.     Breath sounds: Normal breath sounds. No wheezing, rhonchi or rales.  Musculoskeletal:     Cervical back: Normal range of motion and neck supple. No tenderness.  Lymphadenopathy:     Cervical: Cervical adenopathy present.  Skin:    General: Skin is warm and dry.     Capillary Refill: Capillary refill takes less than 2 seconds.     Findings: No rash.  Neurological:     General: No focal deficit present.     Mental Status: She is alert and oriented to person, place, and time.      UC Treatments / Results  Labs (all labs ordered are listed, but only abnormal results are displayed) Labs Reviewed  GROUP A STREP BY PCR - Abnormal; Notable for the following components:  Result Value   Group A Strep by PCR DETECTED (*)    All other components within  normal limits  RESP PANEL BY RT-PCR (FLU A&B, COVID) ARPGX2    EKG   Radiology No results found.  Procedures Procedures (including critical care time)  Medications Ordered in UC Medications - No data to display  Initial Impression / Assessment and Plan / UC Course  I have reviewed the triage vital signs and the nursing notes.  Pertinent labs & imaging results that were available during my care of the patient were reviewed by me and considered in my medical decision making (see chart for details).   Patient is a pleasant, nontoxic-appearing 23 year old female presenting for evaluation of sore throat and bodyaches as outlined HPI above.  She does have edematous and erythematous tonsillar pillars with white exudate.  She also has anterior cervical lymphadenopathy on exam.  No upper or lower respiratory symptoms.  I will order a strep PCR as well as a COVID and flu PCR.  Strep PCR is positive.  Respiratory panel is negative for COVID or influenza.  Will discharge patient with a diagnosis of strep pharyngitis and start her on azithromycin  once daily for 5 days as she has an allergy to amoxicillin.  Return precautions reviewed.  Work note provided.   Final Clinical Impressions(s) / UC Diagnoses   Final diagnoses:  Strep pharyngitis     Discharge Instructions      Take the azithromycin  daily for 5 days for treatment of your strep throat.  Gargle with warm salt water 2-3 times a day to soothe your throat, aid in pain relief, and aid in healing.  Take over-the-counter ibuprofen  according to the package instructions as needed for pain.  You can also use Chloraseptic or Sucrets lozenges, 1 lozenge every 2 hours as needed for throat pain.  If you develop any new or worsening symptoms return for reevaluation.    ED Prescriptions     Medication Sig Dispense Auth. Provider   azithromycin  (ZITHROMAX  Z-PAK) 250 MG tablet Take 1 tablet (250 mg total) by mouth daily. Take 2 tablets  on the first day and then 1 tablet daily thereafter for a total of 5 days of treatment. 6 tablet Kent Pear, NP      PDMP not reviewed this encounter.   Kent Pear, NP 08/26/23 1005

## 2023-08-26 NOTE — Discharge Instructions (Addendum)
Take the azithromycin daily for 5 days for treatment of your strep throat.  Gargle with warm salt water 2-3 times a day to soothe your throat, aid in pain relief, and aid in healing.  Take over-the-counter ibuprofen according to the package instructions as needed for pain.  You can also use Chloraseptic or Sucrets lozenges, 1 lozenge every 2 hours as needed for throat pain.  If you develop any new or worsening symptoms return for reevaluation.

## 2023-08-26 NOTE — ED Triage Notes (Signed)
 Pt presents with sore throat and bodyaches since yesterday. Pt has taken tylenol  and throat spray.

## 2023-09-09 ENCOUNTER — Ambulatory Visit
Admission: EM | Admit: 2023-09-09 | Discharge: 2023-09-09 | Disposition: A | Discharge status: Home / Self Care | Payer: Self-pay | Attending: Emergency Medicine | Admitting: Emergency Medicine

## 2023-09-09 DIAGNOSIS — J069 Acute upper respiratory infection, unspecified: Secondary | ICD-10-CM | POA: Insufficient documentation

## 2023-09-09 LAB — RESP PANEL BY RT-PCR (FLU A&B, COVID) ARPGX2
Influenza A by PCR: NEGATIVE
Influenza B by PCR: NEGATIVE
SARS Coronavirus 2 by RT PCR: NEGATIVE

## 2023-09-09 MED ORDER — PROMETHAZINE-DM 6.25-15 MG/5ML PO SYRP: 5.0000 mL | ORAL_SOLUTION | Freq: Four times a day (QID) | ORAL | 0 refills | Status: DC | PRN

## 2023-09-09 MED ORDER — BENZONATATE 100 MG PO CAPS: 200.0000 mg | ORAL_CAPSULE | Freq: Three times a day (TID) | ORAL | 0 refills | Status: DC

## 2023-09-09 MED ORDER — IPRATROPIUM BROMIDE 0.06 % NA SOLN: 2.0000 | Freq: Four times a day (QID) | NASAL | 12 refills | Status: DC

## 2023-09-09 NOTE — ED Triage Notes (Signed)
 Pt c/o light headed, cough, nasal congestion x1day  Pt had strep 2 weeks ago.

## 2023-09-09 NOTE — Discharge Instructions (Addendum)
 Your testing today was negative for COVID influenza.  I do believe you have a respiratory virus which is causing your symptoms.  Use over-the-counter Tylenol  and/or ibuprofen  Corda pack instructions as needed for any fever or pain.  Given that you do have seasonal allergies I recommend using over-the-counter fexofenadine 180 mg once daily to help control your allergy symptoms.Use the Atrovent  nasal spray, 2 squirts in each nostril every 6 hours, as needed for runny nose and postnasal drip.  Use the Tessalon  Perles every 8 hours during the day.  Take them with a small sip of water.  They may give you some numbness to the base of your tongue or a metallic taste in your mouth, this is normal.  Use the Promethazine  DM cough syrup at bedtime for cough and congestion.  It will make you drowsy so do not take it during the day.  Return for reevaluation or see your primary care provider for any new or worsening symptoms.

## 2023-09-09 NOTE — ED Provider Notes (Signed)
 [facility]    CSN: [id number] Arrival date & time: 09/09/23  1528      History   Chief Complaint No chief complaint on file.   HPI Dana Phillips is a 23 y.o. female.   HPI  23 year old female with past medical history significant for seizures, GERD, anxiety, and insomnia presents for evaluation of respiratory symptoms that started yesterday to include nasal congestion with green nasal discharge, ear fullness, lightheadedness, and a cough that is productive for green sputum.  She denies any fever, sore throat, shortness of breath, or wheezing.  No known sick contacts and no recent travel out of the state or country.  Past Medical History:  Diagnosis Date   Anxiety    GERD (gastroesophageal reflux disease)    Kidney infection    Seizures (HCC)     Patient Active Problem List   Diagnosis Date Noted   Insomnia 06/14/2023   Anxiety and depression 03/11/2021   Chronic bilateral low back pain without sciatica 01/26/2020    Past Surgical History:  Procedure Laterality Date   NO PAST SURGERIES     WISDOM TOOTH EXTRACTION      OB History     Gravida  0   Para  0   Term  0   Preterm  0   AB  0   Living  0      SAB  0   IAB  0   Ectopic  0   Multiple  0   Live Births  0            Home Medications    Prior to Admission medications   Medication Sig Start Date End Date Taking? Authorizing Provider  benzonatate  (TESSALON ) 100 MG capsule Take 2 capsules (200 mg total) by mouth every 8 (eight) hours. 09/09/23  Yes Kent Pear, NP  busPIRone  (BUSPAR ) 10 MG tablet Take 1 tablet (10 mg total) by mouth 2 (two) times daily as needed. 02/16/23  Yes Karamalegos, Kayleen Party, DO  etonogestrel  (NEXPLANON ) 68 MG IMPL implant 1 each (68 mg total) by Subdermal route once for 1 dose. 01/09/21  Yes Copland, Alicia B, PA-C  fluticasone  (FLONASE ) 50 MCG/ACT nasal spray Place 2 sprays into both nostrils daily. 03/22/23  Yes Ethlyn Herd, MD   hydrOXYzine  (VISTARIL ) 25 MG capsule Take 1-2 capsules (25-50 mg total) by mouth at bedtime as needed. 06/14/23  Yes Carollynn Cirri, NP  NON FORMULARY Take 75 mg by mouth daily. CBD Gummy   Yes [provider]  promethazine -dextromethorphan (PROMETHAZINE -DM) 6.25-15 MG/5ML syrup Take 5 mLs by mouth 4 (four) times daily as needed. 09/09/23  Yes Kent Pear, NP  ipratropium (ATROVENT ) 0.06 % nasal spray Place 2 sprays into both nostrils 4 (four) times daily. 09/09/23   Kent Pear, NP    Family History Family History  Problem Relation Age of Onset   Hypertension Mother    Sudden Cardiac Death Neg Hx     Social History Social History   Tobacco Use   Smoking status: Never   Smokeless tobacco: Never  Vaping Use   Vaping status: Every Day   Substances: Nicotine , Flavoring  Substance Use Topics   Alcohol use: No   Drug use: Yes    Types: Marijuana    Comment: CBD gummies     Allergies   Amoxicillin   Review of Systems Review of Systems  Constitutional:  Negative for fever.  HENT:  Positive for congestion, ear pain and rhinorrhea. Negative  for sore throat.   Respiratory:  Positive for cough. Negative for shortness of breath and wheezing.   Neurological:  Positive for light-headedness.     Physical Exam Triage Vital Signs ED Triage Vitals  Encounter Vitals Group     BP      Girls Systolic BP Percentile      Girls Diastolic BP Percentile      Boys Systolic BP Percentile      Boys Diastolic BP Percentile      Pulse      Resp      Temp      Temp src      SpO2      Weight      Height      Head Circumference      Peak Flow      Pain Score      Pain Loc      Pain Education      Exclude from Growth Chart    No data found.  Updated Vital Signs BP 127/71 (BP Location: Left Arm)   Pulse (!) 104   Temp 98.3 F (36.8 C) (Oral)   Ht 5' 9 (1.753 m)   Wt 195 lb (88.5 kg)   LMP 09/04/2023   SpO2 100%   BMI 28.80 kg/m   Visual Acuity Right Eye  Distance:   Left Eye Distance:   Bilateral Distance:    Right Eye Near:   Left Eye Near:    Bilateral Near:     Physical Exam Vitals and nursing note reviewed.  Constitutional:      Appearance: Normal appearance. She is not ill-appearing.  HENT:     Head: Normocephalic and atraumatic.     Right Ear: Ear canal and external ear normal. There is no impacted cerumen.     Left Ear: Ear canal and external ear normal. There is no impacted cerumen.     Ears:     Comments: Bilateral tympanic membranes are mildly erythematous without injection or loss of landmarks.  No effusions noted.  Both EACs are clear.    Nose: Congestion and rhinorrhea present.     Comments: Nasal mucosa is edematous and erythematous with clear discharge in both nares.    Mouth/Throat:     Mouth: Mucous membranes are moist.     Pharynx: Oropharynx is clear. No oropharyngeal exudate or posterior oropharyngeal erythema.  Neck:     Comments: Bilateral anterior, nontender cervical lymphadenopathy present. Cardiovascular:     Rate and Rhythm: Normal rate and regular rhythm.     Pulses: Normal pulses.     Heart sounds: Normal heart sounds. No murmur heard.    No friction rub. No gallop.  Pulmonary:     Effort: Pulmonary effort is normal.     Breath sounds: Normal breath sounds. No wheezing, rhonchi or rales.   Musculoskeletal:     Cervical back: Normal range of motion and neck supple. No tenderness.  Lymphadenopathy:     Cervical: Cervical adenopathy present.   Skin:    General: Skin is warm and dry.     Capillary Refill: Capillary refill takes less than 2 seconds.     Findings: No rash.   Neurological:     General: No focal deficit present.     Mental Status: She is alert and oriented to person, place, and time.      UC Treatments / Results  Labs (all labs ordered are listed, but only abnormal results are displayed) Labs Reviewed  RESP PANEL BY RT-PCR (FLU A&B, COVID) ARPGX2    EKG   Radiology No  results found.  Procedures Procedures (including critical care time)  Medications Ordered in UC Medications - No data to display  Initial Impression / Assessment and Plan / UC Course  I have reviewed the triage vital signs and the nursing notes.  Pertinent labs & imaging results that were available during my care of the patient were reviewed by me and considered in my medical decision making (see chart for details).   Patient is a pleasant, nontoxic-appearing 23 year old female presenting for evaluation of respiratory symptoms as outlined in HPI above.  Physical exam does reveal inflammation of her respiratory tract as evidenced by inflamed nasal mucosa with clear rhinorrhea.  Oropharyngeal exam is benign.  Cardiopulmonary exam Nischal lung sounds all fields.  Differential diagnose include COVID, influenza, viral respiratory illness.  I will order a COVID and flu PCR  Respiratory panel is negative for COVID or influenza.  I will discharge patient home with a diagnosis of viral URI with a cough with a prescription for Atrovent  nasal spray to up with nasal congestion.  Tessalon  Perles and Promethazine  DM cough syrup for cough and congestion.   Final Clinical Impressions(s) / UC Diagnoses   Final diagnoses:  Viral URI with cough     Discharge Instructions      Your testing today was negative for COVID influenza.  I do believe you have a respiratory virus which is causing your symptoms.  Use over-the-counter Tylenol  and/or ibuprofen  Corda pack instructions as needed for any fever or pain.  Given that you do have seasonal allergies I recommend using over-the-counter fexofenadine 180 mg once daily to help control your allergy symptoms.Use the Atrovent  nasal spray, 2 squirts in each nostril every 6 hours, as needed for runny nose and postnasal drip.  Use the Tessalon  Perles every 8 hours during the day.  Take them with a small sip of water.  They may give you some numbness to the base of  your tongue or a metallic taste in your mouth, this is normal.  Use the Promethazine  DM cough syrup at bedtime for cough and congestion.  It will make you drowsy so do not take it during the day.  Return for reevaluation or see your primary care provider for any new or worsening symptoms.      ED Prescriptions     Medication Sig Dispense Auth. Provider   ipratropium (ATROVENT ) 0.06 % nasal spray Place 2 sprays into both nostrils 4 (four) times daily. 15 mL Kent Pear, NP   benzonatate  (TESSALON ) 100 MG capsule Take 2 capsules (200 mg total) by mouth every 8 (eight) hours. 21 capsule Kent Pear, NP   promethazine -dextromethorphan (PROMETHAZINE -DM) 6.25-15 MG/5ML syrup Take 5 mLs by mouth 4 (four) times daily as needed. 118 mL Kent Pear, NP      PDMP not reviewed this encounter.   Kent Pear, NP 09/09/23 1704

## 2023-09-13 ENCOUNTER — Ambulatory Visit: Payer: Self-pay | Admitting: Internal Medicine

## 2023-09-13 VITALS — BP 124/74 | HR 97 | Temp 98.6°F | Ht 69.0 in | Wt 191.2 lb

## 2023-09-13 DIAGNOSIS — F17200 Nicotine dependence, unspecified, uncomplicated: Secondary | ICD-10-CM

## 2023-09-13 DIAGNOSIS — J309 Allergic rhinitis, unspecified: Secondary | ICD-10-CM

## 2023-09-13 DIAGNOSIS — J069 Acute upper respiratory infection, unspecified: Secondary | ICD-10-CM

## 2023-09-13 MED ORDER — PREDNISONE 10 MG PO TABS: ORAL_TABLET | ORAL | 0 refills | Status: DC

## 2023-09-13 NOTE — Patient Instructions (Signed)
 Allergic Rhinitis, Adult  Allergic rhinitis is a reaction to allergens. Allergens are things that can cause an allergic reaction. This condition affects the lining inside the nose (mucous membrane). There are two types of allergic rhinitis: Seasonal. This type is also called hay fever. It happens only during some times of the year. Perennial. This type can happen at any time of the year. This condition cannot be spread from person to person (is not contagious). It can be mild, bad, or very bad. It can develop at any age and may be outgrown. What are the causes? Pollen from grasses, trees, and weeds. Other causes can be: Dust mites. Smoke. Mold. Car fumes. The pee (urine), spit, or dander of pets. Dander is dead skin cells from a pet. What increases the risk? You are more likely to develop this condition if: You have allergies in your family. You have problems like allergies in your family. You may have: Swelling of parts of your eyes and eyelids. Asthma. This affects how you breathe. Long-term redness and swelling on your skin. Food allergies. What are the signs or symptoms? The main symptom of this condition is a runny or stuffy nose (nasal congestion). Other symptoms may include: Sneezing or coughing. Itching and tearing of your eyes. Mucus that drips down the back of your throat (postnasal drip). This may cause a sore throat. Trouble sleeping. Feeling tired. Headache. How is this treated? There is no cure for this condition. You should avoid things that you are allergic to. Treatment can help to relieve symptoms. This may include: Medicines that block allergy symptoms, such as anti-inflammatories or antihistamines. These may be given as a shot, nasal spray, or pill. Avoiding things you are allergic to. Medicines that give you some of what you are allergic to over time. This is called immunotherapy. It is done if other treatments do not help. You may get: Shots. Medicine under  your tongue. Stronger medicines, if other treatments do not help. Follow these instructions at home: Avoiding allergens Find out what things you are allergic to and avoid them. To do this, try these things: If you get allergies any time of year: Replace carpet with wood, tile, or vinyl flooring. Carpet can trap pet dander and dust. Do not smoke. Do not allow smoking in your home. Change your heating and air conditioning filters at least once a month. If you get allergies only some times of the year: Keep windows closed when you can. Plan things to do outside when pollen counts are lowest. Check pollen counts before you plan things to do outside. When you come indoors, change your clothes and shower before you sit on furniture or bedding. If you are allergic to a pet: Keep the pet out of your bedroom. Vacuum, sweep, and dust often. General instructions Take over-the-counter and prescription medicines only as told by your doctor. Drink enough fluid to keep your pee pale yellow. Where to find more information American Academy of Allergy, Asthma & Immunology: aaaai.org Contact a doctor if: You have a fever. You get a cough that does not go away. You make high-pitched whistling sounds when you breathe, most often when you breathe out (wheeze). Your symptoms slow you down. Your symptoms stop you from doing your normal things each day. Get help right away if: You are short of breath. This symptom may be an emergency. Get help right away. Call 911. Do not wait to see if the symptom will go away. Do not drive yourself to the  hospital. This information is not intended to replace advice given to you by your health care provider. Make sure you discuss any questions you have with your health care provider. Document Revised: 11/17/2021 Document Reviewed: 11/17/2021 Elsevier Patient Education  2024 [employer].

## 2023-09-13 NOTE — Progress Notes (Signed)
 Subjective:    Patient ID: Dana Phillips, female    DOB: 2000/06/09, 23 y.o.   MRN: [id number]  HPI  Discussed the use of AI scribe software for clinical note transcription with the patient, who gave verbal consent to proceed.  Dana Phillips is a 23 year old female who presents with persistent upper respiratory symptoms and headaches following a recent strep throat diagnosis.  She initially visited urgent care on June 5th with a sore throat and was diagnosed with strep throat after a positive rapid strep test. She was prescribed azithromycin  at that time. On June 19th, she returned to urgent care with upper respiratory symptoms, but her respiratory panel was negative for COVID and flu. She was given promethazine  DM and Tessalon  for symptom management.  Currently, she experiences persistent headaches, congestion, and a worsening cough. The headaches are described as pressure in the temples. She also has a runny nose and nasal congestion, with yellowish discharge when blowing her nose. Her cough produces dark yellow sputum, and she notes a raspy sound when coughing. No shortness of breath, nausea, vomiting, diarrhea, fever, chills, or body aches at present, though she did experience fever and body aches initially when she first became ill.  She has a history of frequent upper respiratory issues, having visited urgent care five times in the past six months for similar symptoms. She denies known environmental or pet allergies and has not been allergy tested. She has no history of asthma. She vapes nicotine  but has reduced usage since becoming ill.  She has been using Flonase  nasal spray daily for the past four days and takes over-the-counter allergy relief once a day. Her mother has a history of severe sinus problems.       Review of Systems   Past Medical History:  Diagnosis Date   Anxiety    GERD (gastroesophageal reflux disease)    Kidney infection    Seizures (HCC)      Current Outpatient Medications  Medication Sig Dispense Refill   benzonatate  (TESSALON ) 100 MG capsule Take 2 capsules (200 mg total) by mouth every 8 (eight) hours. 21 capsule 0   busPIRone  (BUSPAR ) 10 MG tablet Take 1 tablet (10 mg total) by mouth 2 (two) times daily as needed. 180 tablet 0   etonogestrel  (NEXPLANON ) 68 MG IMPL implant 1 each (68 mg total) by Subdermal route once for 1 dose. 1 each 0   fluticasone  (FLONASE ) 50 MCG/ACT nasal spray Place 2 sprays into both nostrils daily. 16 g 0   hydrOXYzine  (VISTARIL ) 25 MG capsule Take 1-2 capsules (25-50 mg total) by mouth at bedtime as needed. 180 capsule 1   ipratropium (ATROVENT ) 0.06 % nasal spray Place 2 sprays into both nostrils 4 (four) times daily. 15 mL 12   NON FORMULARY Take 75 mg by mouth daily. CBD Gummy     promethazine -dextromethorphan (PROMETHAZINE -DM) 6.25-15 MG/5ML syrup Take 5 mLs by mouth 4 (four) times daily as needed. 118 mL 0   No current facility-administered medications for this visit.    Allergies  Allergen Reactions   Amoxicillin Rash    Family History  Problem Relation Age of Onset   Hypertension Mother    Sudden Cardiac Death Neg Hx     Social History   Socioeconomic History   Marital status: Single    Spouse name: Not on file   Number of children: Not on file   Years of education: Not on file   Highest education level: 12th grade  Occupational History   Not on file  Tobacco Use   Smoking status: Never   Smokeless tobacco: Never  Vaping Use   Vaping status: Every Day   Substances: Nicotine , Flavoring  Substance and Sexual Activity   Alcohol use: No   Drug use: Yes    Types: Marijuana    Comment: CBD gummies   Sexual activity: Yes    Birth control/protection: Implant  Other Topics Concern   Not on file  Social History Narrative   Not on file   Social Drivers of Health   Financial Resource Strain: High Risk (09/13/2023)   Overall Financial Resource Strain (CARDIA)     Difficulty of Paying Living Expenses: Hard  Food Insecurity: Food Insecurity Present (09/13/2023)   Hunger Vital Sign    Worried About Running Out of Food in the Last Year: Sometimes true    Ran Out of Food in the Last Year: Never true  Transportation Needs: No Transportation Needs (09/13/2023)   PRAPARE - Administrator, Civil Service (Medical): No    Lack of Transportation (Non-Medical): No  Physical Activity: Insufficiently Active (09/13/2023)   Exercise Vital Sign    Days of Exercise per Week: 7 days    Minutes of Exercise per Session: 20 min  Stress: Stress Concern Present (09/13/2023)   [employer] of Occupational Health - Occupational Stress Questionnaire    Feeling of Stress: Very much  Social Connections: Moderately Isolated (09/13/2023)   Social Connection and Isolation Panel    Frequency of Communication with Friends and Family: Twice a week    Frequency of Social Gatherings with Friends and Family: Once a week    Attends Religious Services: Never    Database administrator or Organizations: No    Attends Engineer, structural: Not on file    Marital Status: Living with partner  Intimate Partner Violence: Not At Risk (06/06/2018)   Received from [facility]   Humiliation, Afraid, Rape, and Kick questionnaire    Fear of Current or Ex-Partner: No    Emotionally Abused: No    Physically Abused: No    Sexually Abused: No     Constitutional: Patient reports headache.  Denies fever, malaise, fatigue, or abrupt weight changes.  HEENT: Patient reports runny nose and nasal congestion.  Denies eye pain, eye redness, ear pain, ringing in the ears, wax buildup, bloody nose, or sore throat. Respiratory: Patient reports cough.  Denies difficulty breathing, shortness of breath.   Cardiovascular: Denies chest pain, chest tightness, palpitations or swelling in the hands or feet.  Gastrointestinal: Denies abdominal pain, bloating, constipation, diarrhea or blood  in the stool.  Musculoskeletal: Patient reports chronic back pain.  Denies decrease in range of motion, difficulty with gait, muscle pain or joint swelling.  Skin: Denies redness, rashes, lesions or ulcercations.   No other specific complaints in a complete review of systems (except as listed in HPI above).      Objective:   Physical Exam BP 124/74 (BP Location: Right Arm, Patient Position: Sitting, Cuff Size: Normal)   Pulse 97   Temp 98.6 F (37 C)   Ht 5' 9 (1.753 m)   Wt 191 lb 3.2 oz (86.7 kg)   LMP 09/04/2023   SpO2 99%   BMI 28.24 kg/m   Wt Readings from Last 3 Encounters:  09/09/23 195 lb (88.5 kg)  06/14/23 184 lb 6.4 oz (83.6 kg)  05/28/23 175 lb (79.4 kg)    General: Appears her  stated age, overweight, in NAD. Skin: Warm, dry and intact. No rashes noted. HEENT: Head: normal shape and size, no sinus tenderness noted; Eyes: sclera white, no icterus, conjunctiva pink, PERRLA and EOMs intact; Ears: Tm's gray and intact, normal light reflex, + serous effusion bilaterally; Nose: mucosa pink and moist, septum midline; Throat/Mouth: Teeth present, mucosa pink and moist, no exudate, lesions or ulcerations noted.  Neck: No adenopathy noted. Cardiovascular: Normal rate and rhythm. S1,S2 noted.  No murmur, rubs or gallops noted. Pulmonary/Chest: Normal effort and positive vesicular breath sounds. No respiratory distress. No wheezes, rales or ronchi noted.  Musculoskeletal: No difficulty with gait.  Neurological: Alert and oriented.  BMET    Component Value Date/Time   NA 139 12/14/2022 0911   K 4.1 12/14/2022 0911   CL 103 12/14/2022 0911   CO2 25 12/14/2022 0911   GLUCOSE 87 12/14/2022 0911   BUN 15 12/14/2022 0911   CREATININE 0.71 12/14/2022 0911   CALCIUM 10.1 12/14/2022 0911   GFRNONAA >60 02/12/2020 1026   GFRNONAA SEE NOTE 07/22/2016 1157   GFRAA NOT CALCULATED 08/01/2019 2057   GFRAA SEE NOTE 07/22/2016 1157    Lipid Panel     Component Value Date/Time    CHOL 193 12/14/2022 0911   TRIG 67 12/14/2022 0911   HDL 78 12/14/2022 0911   CHOLHDL 2.5 12/14/2022 0911   LDLCALC 100 (H) 12/14/2022 0911    CBC    Component Value Date/Time   WBC 5.2 12/14/2022 0911   RBC 4.48 12/14/2022 0911   HGB 14.2 12/14/2022 0911   HCT 43.0 12/14/2022 0911   PLT 240 12/14/2022 0911   MCV 96.0 12/14/2022 0911   MCH 31.7 12/14/2022 0911   MCHC 33.0 12/14/2022 0911   RDW 11.8 12/14/2022 0911   LYMPHSABS 2,344 10/05/2019 1559   MONOABS 0.8 08/01/2019 2057   EOSABS 180 10/05/2019 1559   BASOSABS 19 10/05/2019 1559    Hgb A1C Lab Results  Component Value Date   HGBA1C 5.3 12/14/2022            Assessment & Plan:    Assessment and Plan    Upper respiratory symptoms with tension headaches Persistent upper respiratory symptoms with tension headaches likely due to stress or lack of sleep. No sinus infection or bronchitis. Lungs clear. Possible reactive airway disease or allergies. Avoided antibiotics to prevent resistance. -Multiple urgent care notes and labs reviewed - Prescribed prednisone  taper x 6 days to reduce inflammation and facilitate drainage. - Continue daily Flonase  nasal spray. - Continue daily over-the-counter antihistamine. - Advised reducing or stopping vaping to assess impact on symptoms. - Consider allergy testing if symptoms persist.      RTC in 3 months for your annual exam Angeline Laura, NP

## 2023-12-29 ENCOUNTER — Encounter: Payer: Self-pay | Admitting: Internal Medicine

## 2024-04-08 ENCOUNTER — Ambulatory Visit
Admission: EM | Admit: 2024-04-08 | Discharge: 2024-04-08 | Disposition: A | Discharge status: Home / Self Care | Payer: Self-pay | Attending: Family Medicine | Admitting: Family Medicine

## 2024-04-08 ENCOUNTER — Encounter: Payer: Self-pay | Admitting: Emergency Medicine

## 2024-04-08 DIAGNOSIS — B349 Viral infection, unspecified: Secondary | ICD-10-CM

## 2024-04-08 MED ORDER — ONDANSETRON 4 MG PO TBDP: 4.0000 mg | ORAL_TABLET | Freq: Three times a day (TID) | ORAL | 0 refills | Status: AC | PRN

## 2024-04-08 MED ORDER — PROMETHAZINE HCL 25 MG PO TABS: 25.0000 mg | ORAL_TABLET | Freq: Three times a day (TID) | ORAL | 0 refills | Status: AC | PRN

## 2024-04-08 MED ORDER — PSEUDOEPH-BROMPHEN-DM 30-2-10 MG/5ML PO SYRP: 5.0000 mL | ORAL_SOLUTION | Freq: Four times a day (QID) | ORAL | 0 refills | Status: AC | PRN

## 2024-04-08 NOTE — Discharge Instructions (Signed)
 Medication as prescribed.  Push fluids.  If you worsen, go directly to the hospital.

## 2024-04-08 NOTE — ED Triage Notes (Signed)
 Patient c/o cough, congestion, chills and diarrhea that started on Monday.  Patient reports vomiting yesterday.  Patient reports fevers.  Patient states that her husband had the Flu last week.

## 2024-04-08 NOTE — ED Provider Notes (Signed)
 " [facility]    CSN: [id number] Arrival date & time: 04/08/24  1115      History   Chief Complaint Chief Complaint  Patient presents with   Cough    FLU Exposure    HPI  24 year old female presents with above complaints.  Has been sick since Monday.  Reports cough, congestion, nausea, vomiting or diarrhea.  Initially had fever.  No fever currently.  Has been exposed to influenza.  She is out of the treatment window.  She is most bothered by GI symptoms.   Past Medical History:  Diagnosis Date   Anxiety    GERD (gastroesophageal reflux disease)    Kidney infection    Seizures (HCC)     Patient Active Problem List   Diagnosis Date Noted   Insomnia 06/14/2023   Anxiety and depression 03/11/2021   Chronic bilateral low back pain without sciatica 01/26/2020    Past Surgical History:  Procedure Laterality Date   NO PAST SURGERIES     WISDOM TOOTH EXTRACTION      OB History     Gravida  0   Para  0   Term  0   Preterm  0   AB  0   Living  0      SAB  0   IAB  0   Ectopic  0   Multiple  0   Live Births  0            Home Medications    Prior to Admission medications  Medication Sig Start Date End Date Taking? Authorizing Provider  brompheniramine-pseudoephedrine -DM 30-2-10 MG/5ML syrup Take 5 mLs by mouth 4 (four) times daily as needed (Respiratory symptoms). 04/08/24  Yes Zanyla Klebba G, DO  ondansetron  (ZOFRAN -ODT) 4 MG disintegrating tablet Take 1 tablet (4 mg total) by mouth every 8 (eight) hours as needed for nausea or vomiting. 04/08/24  Yes Haron Beilke G, DO  promethazine  (PHENERGAN ) 25 MG tablet Take 1 tablet (25 mg total) by mouth every 8 (eight) hours as needed for refractory nausea / vomiting. 04/08/24  Yes Henessy Rohrer G, DO  busPIRone  (BUSPAR ) 10 MG tablet Take 1 tablet (10 mg total) by mouth 2 (two) times daily as needed. 02/16/23   Karamalegos, Marsa PARAS, DO  etonogestrel  (NEXPLANON ) 68 MG IMPL implant 1 each (68 mg  total) by Subdermal route once for 1 dose. 01/09/21   Copland, Alicia B, PA-C  fluticasone  (FLONASE ) 50 MCG/ACT nasal spray Place 2 sprays into both nostrils daily. 03/22/23   Van Knee, MD  hydrOXYzine  (VISTARIL ) 25 MG capsule Take 1-2 capsules (25-50 mg total) by mouth at bedtime as needed. 06/14/23   Antonette Angeline ORN, NP  NON FORMULARY Take 75 mg by mouth daily. CBD Gummy    [provider]    Family History Family History  Problem Relation Age of Onset   Hypertension Mother    Sudden Cardiac Death Neg Hx     Social History Social History[1]   Allergies   Amoxicillin   Review of Systems Review of Systems Per HPI  Physical Exam Triage Vital Signs ED Triage Vitals  Encounter Vitals Group     BP 04/08/24 1159 120/82     Girls Systolic BP Percentile --      Girls Diastolic BP Percentile --      Boys Systolic BP Percentile --      Boys Diastolic BP Percentile --      Pulse Rate 04/08/24 1159 71  Resp 04/08/24 1159 15     Temp 04/08/24 1159 98.3 F (36.8 C)     Temp Source 04/08/24 1159 Oral     SpO2 04/08/24 1159 98 %     Weight 04/08/24 1158 191 lb 2.2 oz (86.7 kg)     Height 04/08/24 1158 5' 9 (1.753 m)     Head Circumference --      Peak Flow --      Pain Score 04/08/24 1158 5     Pain Loc --      Pain Education --      Exclude from Growth Chart --    No data found.  Updated Vital Signs BP 120/82 (BP Location: Right Arm)   Pulse 71   Temp 98.3 F (36.8 C) (Oral)   Resp 15   Ht 5' 9 (1.753 m)   Wt 86.7 kg   SpO2 98%   BMI 28.23 kg/m   Visual Acuity Right Eye Distance:   Left Eye Distance:   Bilateral Distance:    Right Eye Near:   Left Eye Near:    Bilateral Near:     Physical Exam Vitals and nursing note reviewed.  Constitutional:      General: She is not in acute distress.    Appearance: Normal appearance.  HENT:     Head: Normocephalic and atraumatic.     Mouth/Throat:     Pharynx: Oropharynx is clear.   Cardiovascular:     Rate and Rhythm: Normal rate and regular rhythm.  Pulmonary:     Effort: Pulmonary effort is normal.     Breath sounds: Normal breath sounds.  Neurological:     Mental Status: She is alert.      UC Treatments / Results  Labs (all labs ordered are listed, but only abnormal results are displayed) Labs Reviewed - No data to display  EKG   Radiology No results found.  Procedures Procedures (including critical care time)  Medications Ordered in UC Medications - No data to display  Initial Impression / Assessment and Plan / UC Course  I have reviewed the triage vital signs and the nursing notes.  Pertinent labs & imaging results that were available during my care of the patient were reviewed by me and considered in my medical decision making (see chart for details).    25 year old female presents with a viral illness.  Suspected influenza.  She is out of the treatment window and therefore testing was not done.  Treating with Zofran , Bromfed.  Promethazine  for refractory nausea and vomiting.  If she worsens, she is to go to the ER.  Final Clinical Impressions(s) / UC Diagnoses   Final diagnoses:  Viral illness   Discharge Instructions   None    ED Prescriptions     Medication Sig Dispense Auth. Provider   ondansetron  (ZOFRAN -ODT) 4 MG disintegrating tablet Take 1 tablet (4 mg total) by mouth every 8 (eight) hours as needed for nausea or vomiting. 20 tablet Kamen Hanken G, DO   promethazine  (PHENERGAN ) 25 MG tablet Take 1 tablet (25 mg total) by mouth every 8 (eight) hours as needed for refractory nausea / vomiting. 30 tablet Keylin Podolsky G, DO   brompheniramine-pseudoephedrine -DM 30-2-10 MG/5ML syrup Take 5 mLs by mouth 4 (four) times daily as needed (Respiratory symptoms). 120 mL Darvell Monteforte G, DO      PDMP not reviewed this encounter.    [1]  Social History Tobacco Use   Smoking status: Never   Smokeless  tobacco: Never  Vaping Use   Vaping  status: Every Day   Substances: Nicotine , Flavoring  Substance Use Topics   Alcohol use: No   Drug use: Yes    Types: Marijuana    Comment: CBD gummies     Sahir Tolson G, DO 04/08/24 1259  "
